# Patient Record
Sex: Male | Born: 1970
Health system: Southern US, Community
[De-identification: ages and names within clinical notes are randomized; demographics above are authoritative.]

## PROBLEM LIST (undated history)

## (undated) DIAGNOSIS — K219 Gastro-esophageal reflux disease without esophagitis: Secondary | ICD-10-CM

## (undated) DIAGNOSIS — K222 Esophageal obstruction: Secondary | ICD-10-CM

## (undated) DIAGNOSIS — T7840XA Allergy, unspecified, initial encounter: Secondary | ICD-10-CM

## (undated) DIAGNOSIS — K760 Fatty (change of) liver, not elsewhere classified: Secondary | ICD-10-CM

## (undated) DIAGNOSIS — I10 Essential (primary) hypertension: Principal | ICD-10-CM

## (undated) DIAGNOSIS — F419 Anxiety disorder, unspecified: Secondary | ICD-10-CM

## (undated) DIAGNOSIS — K449 Diaphragmatic hernia without obstruction or gangrene: Secondary | ICD-10-CM

## (undated) DIAGNOSIS — E785 Hyperlipidemia, unspecified: Secondary | ICD-10-CM

## (undated) DIAGNOSIS — E119 Type 2 diabetes mellitus without complications: Secondary | ICD-10-CM

## (undated) HISTORY — DX: Anxiety disorder, unspecified: F41.9

## (undated) HISTORY — PX: WISDOM TOOTH EXTRACTION: SHX21

## (undated) HISTORY — DX: Fatty (change of) liver, not elsewhere classified: K76.0

## (undated) HISTORY — DX: Esophageal obstruction: K22.2

## (undated) HISTORY — DX: Diaphragmatic hernia without obstruction or gangrene: K44.9

## (undated) HISTORY — DX: Hyperlipidemia, unspecified: E78.5

## (undated) HISTORY — DX: Type 2 diabetes mellitus without complications: E11.9

## (undated) HISTORY — PX: UPPER GASTROINTESTINAL ENDOSCOPY: SHX188

## (undated) HISTORY — DX: Essential (primary) hypertension: I10

## (undated) HISTORY — DX: Gastro-esophageal reflux disease without esophagitis: K21.9

## (undated) HISTORY — DX: Hemochromatosis, unspecified: E83.119

## (undated) HISTORY — DX: Allergy, unspecified, initial encounter: T78.40XA

---

## 1998-06-27 ENCOUNTER — Ambulatory Visit (HOSPITAL_COMMUNITY): Admission: RE | Admit: 1998-06-27 | Discharge: 1998-06-27 | Payer: Self-pay | Admitting: Gastroenterology

## 1998-06-27 HISTORY — PX: LIVER BIOPSY: SHX301

## 2005-03-26 DIAGNOSIS — I10 Essential (primary) hypertension: Secondary | ICD-10-CM

## 2005-03-26 HISTORY — DX: Essential (primary) hypertension: I10

## 2005-04-15 ENCOUNTER — Ambulatory Visit: Payer: Self-pay | Admitting: Family Medicine

## 2005-05-27 ENCOUNTER — Ambulatory Visit: Payer: Self-pay | Admitting: Family Medicine

## 2005-05-30 ENCOUNTER — Ambulatory Visit: Payer: Self-pay | Admitting: Family Medicine

## 2005-07-11 ENCOUNTER — Ambulatory Visit: Payer: Self-pay | Admitting: Family Medicine

## 2005-08-28 ENCOUNTER — Ambulatory Visit: Payer: Self-pay | Admitting: Family Medicine

## 2005-08-30 ENCOUNTER — Ambulatory Visit: Payer: Self-pay | Admitting: Family Medicine

## 2006-09-02 ENCOUNTER — Ambulatory Visit: Payer: Self-pay | Admitting: Family Medicine

## 2006-09-02 LAB — CONVERTED CEMR LAB: TSH: 2.53 microintl units/mL

## 2006-09-05 ENCOUNTER — Ambulatory Visit: Payer: Self-pay | Admitting: Family Medicine

## 2007-04-20 ENCOUNTER — Ambulatory Visit: Payer: Self-pay | Admitting: Family Medicine

## 2007-04-20 DIAGNOSIS — D485 Neoplasm of uncertain behavior of skin: Secondary | ICD-10-CM | POA: Insufficient documentation

## 2007-10-07 ENCOUNTER — Encounter: Payer: Self-pay | Admitting: Family Medicine

## 2007-10-07 ENCOUNTER — Ambulatory Visit: Payer: Self-pay | Admitting: Family Medicine

## 2007-10-07 DIAGNOSIS — E7800 Pure hypercholesterolemia, unspecified: Secondary | ICD-10-CM | POA: Insufficient documentation

## 2007-10-07 DIAGNOSIS — I10 Essential (primary) hypertension: Secondary | ICD-10-CM | POA: Insufficient documentation

## 2007-10-07 DIAGNOSIS — K760 Fatty (change of) liver, not elsewhere classified: Secondary | ICD-10-CM | POA: Insufficient documentation

## 2007-10-07 DIAGNOSIS — F419 Anxiety disorder, unspecified: Secondary | ICD-10-CM | POA: Insufficient documentation

## 2007-10-07 LAB — CONVERTED CEMR LAB
BUN: 13 mg/dL (ref 6–23)
Basophils Absolute: 0 10*3/uL (ref 0.0–0.1)
CO2: 26 meq/L (ref 19–32)
Eosinophils Absolute: 0.6 10*3/uL (ref 0.0–0.6)
GFR calc Af Amer: 121 mL/min
GFR calc non Af Amer: 100 mL/min
HCT: 45.2 % (ref 39.0–52.0)
Hemoglobin: 15.3 g/dL (ref 13.0–17.0)
Monocytes Absolute: 0.5 10*3/uL (ref 0.2–0.7)
Monocytes Relative: 7.7 % (ref 3.0–11.0)
Neutro Abs: 3 10*3/uL (ref 1.4–7.7)
RBC: 4.96 M/uL (ref 4.22–5.81)
RDW: 11.7 % (ref 11.5–14.6)
Sodium: 138 meq/L (ref 135–145)
WBC: 7 10*3/uL (ref 4.5–10.5)

## 2007-11-27 ENCOUNTER — Encounter: Payer: Self-pay | Admitting: Family Medicine

## 2007-12-10 ENCOUNTER — Ambulatory Visit: Payer: Self-pay | Admitting: Family Medicine

## 2007-12-10 DIAGNOSIS — IMO0001 Reserved for inherently not codable concepts without codable children: Secondary | ICD-10-CM | POA: Insufficient documentation

## 2007-12-16 ENCOUNTER — Ambulatory Visit: Payer: Self-pay | Admitting: Family Medicine

## 2007-12-17 ENCOUNTER — Encounter (INDEPENDENT_AMBULATORY_CARE_PROVIDER_SITE_OTHER): Payer: Self-pay | Admitting: Internal Medicine

## 2008-01-20 ENCOUNTER — Ambulatory Visit: Payer: Self-pay | Admitting: Internal Medicine

## 2008-01-21 ENCOUNTER — Encounter (INDEPENDENT_AMBULATORY_CARE_PROVIDER_SITE_OTHER): Payer: Self-pay | Admitting: Internal Medicine

## 2008-02-01 ENCOUNTER — Ambulatory Visit: Payer: Self-pay | Admitting: Internal Medicine

## 2008-02-04 ENCOUNTER — Ambulatory Visit: Payer: Self-pay | Admitting: Family Medicine

## 2008-02-05 ENCOUNTER — Encounter (INDEPENDENT_AMBULATORY_CARE_PROVIDER_SITE_OTHER): Payer: Self-pay | Admitting: Internal Medicine

## 2008-02-08 ENCOUNTER — Ambulatory Visit: Payer: Self-pay | Admitting: Family Medicine

## 2008-02-10 ENCOUNTER — Ambulatory Visit: Payer: Self-pay | Admitting: Family Medicine

## 2008-04-28 ENCOUNTER — Ambulatory Visit: Payer: Self-pay | Admitting: Family Medicine

## 2008-04-29 ENCOUNTER — Encounter: Payer: Self-pay | Admitting: Family Medicine

## 2009-03-20 ENCOUNTER — Telehealth: Payer: Self-pay | Admitting: Family Medicine

## 2009-03-20 ENCOUNTER — Telehealth (INDEPENDENT_AMBULATORY_CARE_PROVIDER_SITE_OTHER): Payer: Self-pay | Admitting: *Deleted

## 2009-05-26 ENCOUNTER — Ambulatory Visit: Payer: Self-pay | Admitting: Family Medicine

## 2009-05-28 LAB — CONVERTED CEMR LAB
ALT: 82 units/L — ABNORMAL HIGH (ref 0–53)
AST: 40 units/L — ABNORMAL HIGH (ref 0–37)
Albumin: 4.4 g/dL (ref 3.5–5.2)
BUN: 18 mg/dL (ref 6–23)
Basophils Relative: 0.7 % (ref 0.0–3.0)
Bilirubin, Direct: 0.1 mg/dL (ref 0.0–0.3)
Calcium: 9.1 mg/dL (ref 8.4–10.5)
Creatinine, Ser: 0.9 mg/dL (ref 0.4–1.5)
Creatinine,U: 204 mg/dL
Eosinophils Relative: 4.7 % (ref 0.0–5.0)
GFR calc non Af Amer: 98.99 mL/min (ref >60)
Glucose, Bld: 108 mg/dL — ABNORMAL HIGH (ref 70–99)
HCT: 44.7 % (ref 39.0–52.0)
MCHC: 34.9 g/dL (ref 30.0–36.0)
Monocytes Relative: 7.9 % (ref 3.0–12.0)
Platelets: 244 10*3/uL (ref 150.0–400.0)
Potassium: 4.1 meq/L (ref 3.5–5.1)
RBC: 4.9 M/uL (ref 4.22–5.81)
TSH: 3.69 microintl units/mL (ref 0.35–5.50)
Total CHOL/HDL Ratio: 4

## 2009-05-30 ENCOUNTER — Ambulatory Visit: Payer: Self-pay | Admitting: Family Medicine

## 2009-08-26 DIAGNOSIS — E119 Type 2 diabetes mellitus without complications: Secondary | ICD-10-CM

## 2009-08-26 HISTORY — DX: Type 2 diabetes mellitus without complications: E11.9

## 2010-01-29 ENCOUNTER — Ambulatory Visit: Payer: Self-pay | Admitting: Family Medicine

## 2010-01-29 LAB — CONVERTED CEMR LAB
ALT: 91 units/L — ABNORMAL HIGH (ref 0–53)
Albumin: 4.1 g/dL (ref 3.5–5.2)
BUN: 13 mg/dL (ref 6–23)
Basophils Absolute: 0 10*3/uL (ref 0.0–0.1)
Basophils Relative: 0.4 % (ref 0.0–3.0)
Bilirubin, Direct: 0.1 mg/dL (ref 0.0–0.3)
Calcium: 9.1 mg/dL (ref 8.4–10.5)
Creatinine, Ser: 0.8 mg/dL (ref 0.4–1.5)
Eosinophils Relative: 6.9 % — ABNORMAL HIGH (ref 0.0–5.0)
HCT: 44 % (ref 39.0–52.0)
Hemoglobin: 15.5 g/dL (ref 13.0–17.0)
Lymphocytes Relative: 38.1 % (ref 12.0–46.0)
Lymphs Abs: 2.6 10*3/uL (ref 0.7–4.0)
MCV: 89.6 fL (ref 78.0–100.0)
Monocytes Relative: 7.5 % (ref 3.0–12.0)
Neutrophils Relative %: 47.1 % (ref 43.0–77.0)
RDW: 12.8 % (ref 11.5–14.6)
Sodium: 141 meq/L (ref 135–145)
Total Bilirubin: 0.7 mg/dL (ref 0.3–1.2)
WBC: 6.8 10*3/uL (ref 4.5–10.5)

## 2010-01-31 ENCOUNTER — Encounter: Admission: RE | Admit: 2010-01-31 | Discharge: 2010-01-31 | Payer: Self-pay | Admitting: Family Medicine

## 2010-02-01 ENCOUNTER — Encounter (INDEPENDENT_AMBULATORY_CARE_PROVIDER_SITE_OTHER): Payer: Self-pay | Admitting: *Deleted

## 2010-02-23 DIAGNOSIS — K222 Esophageal obstruction: Secondary | ICD-10-CM

## 2010-02-23 HISTORY — DX: Esophageal obstruction: K22.2

## 2010-03-08 ENCOUNTER — Encounter (INDEPENDENT_AMBULATORY_CARE_PROVIDER_SITE_OTHER): Payer: Self-pay | Admitting: *Deleted

## 2010-03-08 ENCOUNTER — Ambulatory Visit: Payer: Self-pay | Admitting: Gastroenterology

## 2010-03-08 DIAGNOSIS — R1319 Other dysphagia: Secondary | ICD-10-CM | POA: Insufficient documentation

## 2010-03-08 DIAGNOSIS — E669 Obesity, unspecified: Secondary | ICD-10-CM | POA: Insufficient documentation

## 2010-03-08 LAB — CONVERTED CEMR LAB
HCV Ab: NEGATIVE
Tissue Transglutaminase Ab, IgA: 1.8 units (ref <20)

## 2010-03-12 ENCOUNTER — Ambulatory Visit: Payer: Self-pay | Admitting: Gastroenterology

## 2010-03-12 LAB — CONVERTED CEMR LAB
Albumin: 4.5 g/dL (ref 3.5–5.2)
Alkaline Phosphatase: 72 units/L (ref 39–117)
Basophils Relative: 0.8 % (ref 0.0–3.0)
CO2: 28 meq/L (ref 19–32)
Calcium: 9.3 mg/dL (ref 8.4–10.5)
Chloride: 103 meq/L (ref 96–112)
Creatinine, Ser: 0.7 mg/dL (ref 0.4–1.5)
Eosinophils Absolute: 0.4 10*3/uL (ref 0.0–0.7)
Ferritin: 340.1 ng/mL — ABNORMAL HIGH (ref 22.0–322.0)
Glucose, Bld: 172 mg/dL — ABNORMAL HIGH (ref 70–99)
HDL: 40.6 mg/dL (ref >39.00)
Neutrophils Relative %: 50.7 % (ref 43.0–77.0)
Platelets: 252 10*3/uL (ref 150.0–400.0)
RBC: 4.94 M/uL (ref 4.22–5.81)
Saturation Ratios: 26.5 % (ref 20.0–50.0)
Total Bilirubin: 0.7 mg/dL (ref 0.3–1.2)
Total Protein: 7.4 g/dL (ref 6.0–8.3)
Triglycerides: 117 mg/dL (ref 0.0–149.0)
VLDL: 23.4 mg/dL (ref 0.0–40.0)
Vitamin B-12: 527 pg/mL (ref 211–911)
WBC: 6.6 10*3/uL (ref 4.5–10.5)

## 2010-03-14 ENCOUNTER — Ambulatory Visit: Payer: Self-pay | Admitting: Gastroenterology

## 2010-03-14 LAB — CONVERTED CEMR LAB: T4, Total: 5.9 ug/dL (ref 5.0–12.5)

## 2010-04-03 ENCOUNTER — Encounter (INDEPENDENT_AMBULATORY_CARE_PROVIDER_SITE_OTHER): Payer: Self-pay | Admitting: *Deleted

## 2010-04-09 ENCOUNTER — Telehealth: Payer: Self-pay | Admitting: Gastroenterology

## 2010-04-12 ENCOUNTER — Ambulatory Visit: Payer: Self-pay | Admitting: Gastroenterology

## 2010-04-20 ENCOUNTER — Encounter: Payer: Self-pay | Admitting: Gastroenterology

## 2010-04-20 ENCOUNTER — Encounter: Admission: RE | Admit: 2010-04-20 | Discharge: 2010-05-25 | Payer: Self-pay | Admitting: Gastroenterology

## 2010-05-31 ENCOUNTER — Telehealth (INDEPENDENT_AMBULATORY_CARE_PROVIDER_SITE_OTHER): Payer: Self-pay | Admitting: *Deleted

## 2010-05-31 ENCOUNTER — Ambulatory Visit: Payer: Self-pay | Admitting: Family Medicine

## 2010-06-04 ENCOUNTER — Ambulatory Visit: Payer: Self-pay | Admitting: Family Medicine

## 2010-09-25 NOTE — Letter (Signed)
Summary: New Patient letter  [facility]  [address] [place], [place] [address]   Phone: [phone]  Fax: [phone]       02/01/2010 MRN: [id number]  Micheal Cruz [address] [place], [place]  [address]  Dear Micheal Cruz,  Welcome to the Gastroenterology Division at [facility].    You are scheduled to see Dr. Jarold Motto on 03/08/2010 at 8:30AM on the 3rd floor at [facility], [address]  We ask that you try to arrive at our office 15 minutes prior to your appointment time to allow for check-in.  We would like you to complete the enclosed self-administered evaluation form prior to your visit and bring it with you on the day of your appointment.  We will review it with you.  Also, please bring a complete list of all your medications or, if you prefer, bring the medication bottles and we will list them.  Please bring your insurance card so that we may make a copy of it.  If your insurance requires a referral to see a specialist, please bring your referral form from your primary care physician.  Co-payments are due at the time of your visit and may be paid by cash, check or credit card.     Your office visit will consist of a consult with your physician (includes a physical exam), any laboratory testing he/she may order, scheduling of any necessary diagnostic testing (e.g. x-ray, ultrasound, CT-scan), and scheduling of a procedure (e.g. Endoscopy, Colonoscopy) if required.  Please allow enough time on your schedule to allow for any/all of these possibilities.    If you cannot keep your appointment, please call [phone] to cancel or reschedule prior to your appointment date.  This allows [place] the opportunity to schedule an appointment for another patient in need of care.  If you do not cancel or reschedule by 5 p.m. the business day prior to your appointment date, you will be charged a $50.00 late cancellation/no-show fee.    Thank you for choosing  [facility] for your medical needs.  We appreciate the opportunity to care for you.  Please visit [place] at our website  to learn more about our practice.                     Sincerely,                                                             The Gastroenterology Division

## 2010-09-25 NOTE — Assessment & Plan Note (Signed)
Summary: STOMACH DISCOMFORT GOING INTO BACK/JRR   Vital Signs:  Patient profile:   40 year old male Weight:      209 pounds Temp:     98.4 degrees F oral Pulse rate:   64 / minute Pulse rhythm:   regular BP sitting:   138 / 84  (left arm) Cuff size:   large  Vitals Entered By: Sydell Axon LPN (January 30, 8412 10:27 AM) CC: Riight side stomach pain and pressure that goes around into the back area and shoulder blade   History of Present Illness: t ere for abd "discomfort" not pain which began about two weeks ago and was worst Fri. He can't tell if it worsens with fatty foods and he thinks it kind of goes away with eating. It is located in the RUQ and will radiate to right shoulder. He has had mopre gas than usual with this. He had mild discomfort again Sat, none since. He has no idea if FH of GB disease.  Problems Prior to Update: 1)  Health Maintenance Exam  (ICD-V70.0) 2)  Muscle Pain  (ICD-729.1) 3)  Hypercholesterolemia  (ICD-272.0) 4)  Anxiety Depression  (ICD-300.4) 5)  Hyperglycemia  (ICD-790.29) 6)  Fatty Liver Disease  (ICD-571.8) 7)  Hypertension  (ICD-401.9) 8)  Neoplasm, Skin, Uncertain Behavior  (ICD-238.2)  Medications Prior to Update: 1)  Lescol Xl 80 Mg Tb24 (Fluvastatin Sodium) .... Take One By Mouth At Bedtime 2)  Toprol Xl 50 Mg  Tb24 (Metoprolol Succinate) .... Take 11/2 Tablets By Mouth Once A Day  Allergies: 1)  ! Amoxicillin (Amoxicillin)  Physical Exam  General:  Well-developed,well-nourished,in no acute distress; alert,appropriate and cooperative throughout examination, comfortable. Head:  Normocephalic and atraumatic without obvious abnormalities. No apparent alopecia or balding. Eyes:  Conjunctiva clear bilaterally. PERRLA, EOMI Ears:  External ear exam shows no significant lesions or deformities.  Otoscopic examination reveals clear canals, tympanic membranes are intact bilaterally without bulging, retraction, inflammation or discharge. Hearing is  grossly normal bilaterally. Nose:  External nasal examination shows no deformity or inflammation. Nasal mucosa are pink and moist without lesions or exudates. Mouth:  Oral mucosa and oropharynx without lesions or exudates.  Teeth in good repair. Neck:  No deformities, masses, or tenderness noted. Lungs:  Normal respiratory effort, chest expands symmetrically. Lungs are clear to auscultation, no crackles or wheezes. Heart:  Normal rate and regular rhythm. S1 and S2 normal without gallop, murmur, click, rub or other extra sounds. Abdomen:  Bowel sounds positive,abdomen soft and non-tender without masses, organomegaly or hernias noted. Totally nontender to palpation but area of discomfort at GB location.   Impression & Recommendations:  Problem # 1:  ABDOMINAL PAIN RIGHT UPPER QUADRANT (ICD-789.01) Assessment New Avoid fatty foods. Will get labs and schedule Abd U/S to rule out GB dz. If neg, to GI. Orders: TLB-BMP (Basic Metabolic Panel-BMET) (80048-METABOL) TLB-CBC Platelet - w/Differential (85025-CBCD) TLB-Hepatic/Liver Function Pnl (80076-HEPATIC) TLB-Amylase (82150-AMYL) TLB-Lipase (83690-LIPASE) Radiology Referral (Radiology)  Discussed symptom control with the patient.   Complete Medication List: 1)  Lescol Xl 80 Mg Tb24 (Fluvastatin sodium) .... Take one by mouth at bedtime 2)  Toprol Xl 50 Mg Tb24 (Metoprolol succinate) .... Take 1 1/2 tablets by mouth once a day  Patient Instructions: 1)  Refer for Abd U/s.  Current Allergies (reviewed today): ! AMOXICILLIN (AMOXICILLIN)

## 2010-09-25 NOTE — Letter (Signed)
Summary: [name] letter  [facility]  [address] [place], [place] [address]   Phone: [phone]  Fax: [phone]       04/03/2010 MRN: [id number]  Micheal Cruz [address] [place], [place]  [address]  Dear Micheal Cruz [facility] [place] and [place] announce the retirement of Arta Silence, M.D., from full-time practice at the [facility] office effective February 22, 2010 and his plans of returning part-time.  It is important to Dr. Hetty Ely and to our practice that you understand that [facility] has seven physicians in our office for your health care needs.  We will continue to offer the same exceptional care that you have today.    Dr. Hetty Ely has spoken to many of you about his plans for retirement and returning part-time in the fall.   We will continue to work with you through the transition to schedule appointments for you in the office and meet the high standards that [facility] is committed to.   Again, it is with great pleasure that we share the news that Dr. Hetty Ely will return to [facility] in October of 2011 with a reduced schedule.    If you have any questions, or would like to request an appointment with one of our physicians, please call us at [phone] and press the option for Scheduling an appointment.  We take pleasure in providing you with excellent patient care and look forward to seeing you at your next office visit.  Our [facility] Physicians are:  Tillman Abide, M.D. Laurita Quint, M.D. Roxy Manns, M.D. Kerby Nora, M.D. Hannah Beat, M.D. Ruthe Mannan, M.D. We proudly welcomed Raechel Ache, M.D. and Eustaquio Boyden, M.D. to the practice in July/August 2011.  Sincerely,  [facility]

## 2010-09-25 NOTE — Letter (Signed)
Summary: EGD Instructions  [facility]  [address] [place], [place] [address]   Phone: [phone]  Fax: [phone]       Micheal Cruz    February 19, 1971    MRN: [id number]       Procedure Day /Date: Monday, 03/12/10     Arrival Time: 2:00     Procedure Time: 3:00     Location of Procedure:                    [name] (4th [facility])    PREPARATION FOR ENDOSCOPY   On 03/12/10  THE DAY OF THE PROCEDURE:  1.   No solid foods, milk or milk products are allowed after midnight the night before your procedure.  2.   Do not drink anything colored red or purple.  Avoid juices with pulp.  No orange juice.  3.  You may drink clear liquids until 1:00, which is 2 hours before your procedure.                                                                                                CLEAR LIQUIDS INCLUDE: Water Jello Ice Popsicles Tea (sugar ok, no milk/cream) Powdered fruit flavored drinks Coffee (sugar ok, no milk/cream) Gatorade Juice: apple, white grape, white cranberry  Lemonade Clear bullion, consomm, broth Carbonated beverages (any kind) Strained chicken noodle soup Hard Candy   MEDICATION INSTRUCTIONS  Unless otherwise instructed, you should take regular prescription medications with a small sip of water as early as possible the morning of your procedure.                      OTHER INSTRUCTIONS  You will need a responsible adult at least 42 years of age to accompany you and drive you home.   This person must remain in the waiting room during your procedure.  Wear loose fitting clothing that is easily removed.  Leave jewelry and other valuables at home.  However, you may wish to bring a book to read or an iPod/MP3 player to listen to music as you wait for your procedure to start.  Remove all body piercing jewelry and leave at home.  Total time from sign-in until discharge is approximately 2-3 hours.  You should go  home directly after your procedure and rest.  You can resume normal activities the day after your procedure.  The day of your procedure you should not:   Drive   Make legal decisions   Operate machinery   Drink alcohol   Return to work  You will receive specific instructions about eating, activities and medications before you leave.    The above instructions have been reviewed and explained to me by   _______________________    I fully understand and can verbalize these instructions _____________________________ Date _________

## 2010-09-25 NOTE — Progress Notes (Signed)
----   Converted from flag ---- ---- 05/30/2010 1:15 PM, Crawford Givens MD wrote: he only needs fasting glucose and A1c.  790.29.  He had most labs check in 02/2010.   ---- 05/30/2010 11:38 AM, Liane Comber CMA (AAMA) wrote: Lab orders please! Good Morning! This pt is scheduled for cpx labs tomorrow, which labs to draw and dx codes to use? Thanks Tasha ------------------------------

## 2010-09-25 NOTE — Assessment & Plan Note (Signed)
Summary: F/U APPT...LSW.   History of Present Illness Visit Type: Follow-up Visit Primary GI MD: Sheryn Bison MD FACP FAGA Primary Provider: Laurita Quint, MD Requesting Provider: n/a Chief Complaint: Follow up from Endo and Aciphex denial from Insurance,No GI complaints History of Present Illness:   esophageal biopsies consistent with chronic GERD. He is asymptomatic on PPI therapy. There is no dysphagia since his dilation. Iron studies and hemochromatosis genetics are consistent with a heterozygote for hemachromatosis. He has mildly abnormal liver function tests apparently had a negative liver biopsy by Dr. Kinnie Scales 10 years ago. He has no symptoms of chronic liver disease. His TSH level was mildly elevated but thyroid function tests were normal.   GI Review of Systems      Denies abdominal pain, acid reflux, belching, bloating, chest pain, dysphagia with liquids, dysphagia with solids, heartburn, loss of appetite, nausea, vomiting, vomiting blood, weight loss, and  weight gain.        Denies anal fissure, black tarry stools, change in bowel habit, constipation, diarrhea, diverticulosis, fecal incontinence, heme positive stool, hemorrhoids, irritable bowel syndrome, jaundice, light color stool, liver problems, rectal bleeding, and  rectal pain.    Current Medications (verified): 1)  Lescol Xl 80 Mg Tb24 (Fluvastatin Sodium) .... Take One By Mouth At Bedtime 2)  Toprol Xl 50 Mg  Tb24 (Metoprolol Succinate) .... Take 1 1/2 Tablets By Mouth Once A Day 3)  Aciphex 20 Mg  Tbec (Rabeprazole Sodium) .... Take 1 Each Day 30 Minutes Before Meals  Allergies (verified): 1)  ! Amoxicillin (Amoxicillin)  Past History:  Family History: Last updated: 05/30/2009 Father:A 70  HTN; HEART DISEASE ; DM  Mother: :A 82  ANXIETY, HTN; DM; DEPRESSION SISTER A 35 ANXIETY CV:+ FATHER(MI);  HBP:+MOTHER; + FATHER; +PGM;+PGF; +MGM; +MGF DM: +FATHER; + MOTHER GOUT/ARTHRITIS: PROSTATE CANCER:--            ?MGF BREAST/OVARIAN/UTERINE /CANCER: COLON CANCER: NEGATIVE DEPRESSION: +MOTHER; +MUNCLE; +MGM; +MGGF ETOH ABUSE: + MOTHERS SIDE OTHER:+ STROKE ? ?SKIN CANCER IN UNCLE?  Social History: Last updated: 03/08/2010 Marital Status: MarriedLIVES WITH WIFE Children: NONE Occupation: [employer] Patient has never smoked.  Alcohol Use - no Daily Caffeine Use -4 Illicit Drug Use - no  Past medical, surgical, family and social histories (including risk factors) reviewed for relevance to current acute and chronic problems.  Past Medical History: Hypertension: ( 03/2005) Anxiety Disorder GERD  Past Surgical History: Reviewed history from 10/07/2007 and no changes required. LIVER BIOPSY-- FATTY LIVER:(11/O2/1999)  Family History: Reviewed history from 05/30/2009 and no changes required. Father:A 70  HTN; HEART DISEASE ; DM  Mother: :A 58  ANXIETY, HTN; DM; DEPRESSION SISTER A 35 ANXIETY CV:+ FATHER(MI);  HBP:+MOTHER; + FATHER; +PGM;+PGF; +MGM; +MGF DM: +FATHER; + MOTHER GOUT/ARTHRITIS: PROSTATE CANCER:--           ?MGF BREAST/OVARIAN/UTERINE /CANCER: COLON CANCER: NEGATIVE DEPRESSION: +MOTHER; +MUNCLE; +MGM; +MGGF ETOH ABUSE: + MOTHERS SIDE OTHER:+ STROKE ? ?SKIN CANCER IN UNCLE?  Social History: Reviewed history from 03/08/2010 and no changes required. Marital Status: MarriedLIVES WITH WIFE Children: NONE Occupation: [employer] Patient has never smoked.  Alcohol Use - no Daily Caffeine Use -4 Illicit Drug Use - no  Review of Systems  The patient denies allergy/sinus, anemia, anxiety-new, arthritis/joint pain, back pain, blood in urine, breast changes/lumps, change in vision, confusion, cough, coughing up blood, depression-new, fainting, fatigue, fever, headaches-new, hearing problems, heart murmur, heart rhythm changes, itching, muscle pains/cramps, night sweats, nosebleeds, shortness  of breath, skin rash,  sleeping problems, sore throat, swelling of feet/legs, swollen lymph glands, thirst - excessive, urination - excessive, urination changes/pain, urine leakage, vision changes, and voice change.    Vital Signs:  Patient profile:   40 year old male Height:      65 inches Weight:      207 pounds BMI:     34.57 BSA:     2.01 Pulse rate:   76 / minute Pulse rhythm:   regular BP sitting:   132 / 82  (left arm)  Vitals Entered By: Merri Ray CMA Duncan Dull) (April 12, 2010 8:53 AM)  Physical Exam  General:  Well developed, well nourished, no acute distress. Head:  Normocephalic and atraumatic. Eyes:  PERRLA, no icterus.exam deferred to patient's ophthalmologist.   Psych:  Alert and cooperative. Normal mood and affect.   Impression & Recommendations:  Problem # 1:  DYSPHAGIA (FIE-332.95) Assessment Improved Continued reflux regime and daily PPI therapy.  Problem # 2:  HEMOCHROMATOSIS (ICD-275.0) Assessment: Unchanged His heterozygote state for hemochromatosis most likely explains his abnormal liver function test with a previous negative biopsy otherwise. He should have yearly liver function test and serum ferritin levels. There is no direct indication for regular phlebotomies in his case, but I would consider phlebotomies if his liver functions deteriorate with the develops any arthritis, diabetes, cardiovascular problems et Karie Soda. He has been given a copy of the genetic assay report for his own records.  Problem # 3:  ABDOMINAL PAIN RIGHT UPPER QUADRANT (ICD-789.01) Assessment: Improved  Patient Instructions: 1)  Stop Aciphex.  Begin Lansoprazole. 2)  The medication list was reviewed and reconciled.  All changed / newly prescribed medications were explained.  A complete medication list was provided to the patient / caregiver. 3)  Copy sent to : Dr. Laurita Quint 4)  Avoid foods high in acid content ( tomatoes, citrus juices, spicy foods) . Avoid eating within 3 to 4 hours of lying  down or before exercising. Do not over eat; try smaller more frequent meals. Elevate head of bed four inches when sleeping.  Prescriptions: LANSOPRAZOLE 30 MG CPDR (LANSOPRAZOLE) 1 by mouth once daily  #90 x 3   Entered by:   Ashok Cordia RN   Authorized by:   Mardella Layman MD [facility]   Signed by:   Ashok Cordia RN on 04/12/2010   Method used:   Print then Give to Patient   RxID:   [id number]   Appended Document: F/U APPT...LSW.    Clinical Lists Changes  Medications: Removed medication of ACIPHEX 20 MG  TBEC (RABEPRAZOLE SODIUM) Take 1 each day 30 minutes before meals

## 2010-09-25 NOTE — Letter (Signed)
Summary: Patient [facility] Biopsy Results  [facility]  [address] [place], [place] [address]   Phone: [phone]  Fax: [phone]        March 14, 2010 MRN: [id number]    DAIMEN SHOVLIN [address] [place], [place]  [address]    Dear Mr. Inglis,  I am pleased to inform you that the biopsies taken during your recent endoscopic examination did not show any evidence of cancer upon pathologic examination.  Additional information/recommendations:  __No further action is needed at this time.  Please follow-up with      your primary care physician for your other healthcare needs.  __ Please call [phone] to schedule a return visit to review      your condition.  _XX_ Continue with the treatment plan as outlined on the day of your      exam.  __ You should have a repeat endoscopic examination for this problem              in _ months/years.   Please call us if you are having persistent problems or have questions about your condition that have not been fully answered at this time.  Sincerely,  Mardella Layman MD [facility]  This letter has been electronically signed by your physician.  Appended Document: Patient Notice-Endo Biopsy Results letter mailed.

## 2010-09-25 NOTE — Assessment & Plan Note (Signed)
Summary: CPX/SCHALLER'S PT/CLE   Vital Signs:  Patient profile:   40 year old male Weight:      203 pounds Temp:     98.7 degrees F oral Pulse rate:   60 / minute Pulse rhythm:   regular BP sitting:   120 / 80  (left arm) Cuff size:   large  Vitals Entered By: Sydell Axon LPN (June 04, 2010 8:30 AM) CC: 30 Minute checkup   History of Present Illness: CPE- labs reviewed with patient.    GERD- H/o HH and ring, s/p dilation and doing much better.  No chest pain now after dilation.  Hemachromatosis per GI.  H/o fatty liver.  Anxiety.  No meds.  Situational per patient.  +FH.  Has mental exercises to work through the symptoms.    Hypertension:      Using medication without problems or lightheadedness: yes Chest pain with exertion:no Edema:no Short of breath:no Average home TKZ:SWFUXN checked Other issues: exercising most days of the week, started to increase his exercise in 02/2010.   Elevated Cholesterol: Using medications without problems:yes Muscle aches: no Other complaints: labs d/w patient.   Diabetes:  Using medications without difficulties:no meds Hypoglycemic episodes:not checked Hyperglycemic episodes:not checked Feet problems:no Blood Sugars averaging:not checked.  eye exam within last year: no  Allergies: 1)  ! Amoxicillin (Amoxicillin)  Past History:  Past Medical History: Hypertension: ( 03/2005) Anxiety Disorder GERD Hiatal hernia hemachromatosis per GI, ferritin and LFTs q6 months per Dr. Jarold Motto Schatzki's ring s/p dilation 02/2010 per Dr. Jarold Motto DM- diet controlled as of 2011 HLD  Past Surgical History: Reviewed history from 10/07/2007 and no changes required. LIVER BIOPSY-- FATTY LIVER:(11/O2/1999)  Family History: Reviewed history from 05/30/2009 and no changes required. Father:A  HTN; HEART DISEASE ; DM  Mother: :A  ANXIETY, HTN; DM; DEPRESSION SISTER A  ANXIETY CV:+ FATHER(MI);  HBP:+MOTHER; + FATHER; +PGM;+PGF; +MGM;  +MGF DM: +FATHER; + MOTHER GOUT/ARTHRITIS: PROSTATE CANCER:--           ?MGF BREAST/OVARIAN/UTERINE /CANCER: COLON CANCER: NEGATIVE DEPRESSION: +MOTHER; +MUNCLE; +MGM; +MGGF ETOH ABUSE: + MOTHERS SIDE OTHER:+ STROKE ? ?SKIN CANCER IN UNCLE?  Social History: Reviewed history from 03/08/2010 and no changes required. Marital Status: Married, 2000,LIVES WITH WIFE Children: NONE Occupation: [employer] Patient has never smoked.  Alcohol Use - very rare Daily Caffeine Use - iced tea, cutting down Illicit Drug Use - no minimal exercise, enjoys building models  Review of Systems       See HPI.  Otherwise negative.    Physical Exam  General:  GEN: nad, alert and oriented HEENT: mucous membranes moist NECK: supple w/o LA CV: rrr.  no murmur PULM: ctab, no inc wob ABD: soft, +bs EXT: no edema SKIN: no acute rash    Impression & Recommendations:  Problem # 1:  Preventive Health Care (ICD-V70.0) Flu shot to be done at work.  d/w patient AT:FTDD and exercise along with weight.  Tdap up to date.    Problem # 2:  HYPERCHOLESTEROLEMIA (ICD-272.0) no change in meds, prev labs d/w patient.  His updated medication list for this problem includes:    Lescol Xl 80 Mg Tb24 (Fluvastatin sodium) .Marland Kitchen... Take one by mouth at bedtime  Problem # 3:  FATTY LIVER DISEASE (ICD-571.8) d/w patient UK:GURK/YHCWCBJS and weight.  prev Lfts d/w patient.   Problem # 4:  DIABETES MELLITUS (ICD-250.00) d/w patient EG:BTDV/VOHYWVPX and weight.  No change in meds.  Continue to monitor.   Complete  Medication List: 1)  Lescol Xl 80 Mg Tb24 (Fluvastatin sodium) .... Take one by mouth at bedtime 2)  Toprol Xl 50 Mg Tb24 (Metoprolol succinate) .... Take 1 1/2 tablets by mouth once a day 3)  Lansoprazole 30 Mg Cpdr (Lansoprazole) .Marland Kitchen.. 1 by mouth once daily  Patient Instructions: 1)  I would like to see you back in 6 months.  Call Dr. Norval Gable office about getting a follow up  appointment. 2)  I want you to get your A1c drawn again in 6 months a few days before we meet again.  Keep exercising and working on your weight.  Take care, glad to see you today. 3)  Get your flu shot at work. 4)  labs in 6 months.  A1c, 250.00.    ferritin and hepatic panel (sent to Dr. Jarold Motto) 275.0  Current Allergies (reviewed today): ! AMOXICILLIN (AMOXICILLIN)

## 2010-09-25 NOTE — Procedures (Signed)
Summary: Upper Endoscopy  Patient: Micheal Cruz Note: All result statuses are Final unless otherwise noted.  Tests: (1) Upper Endoscopy (EGD)   EGD Upper Endoscopy       DONE     [facility]     [address]     [place], [place]  [address]           ENDOSCOPY PROCEDURE REPORT           PATIENT:  Micheal, Cruz  MR#:  [id number]     BIRTHDATE:  [birth year], 39 yrs. old  GENDER:  male           ENDOSCOPIST:  Vania Rea. Jarold Motto, MD, [facility]     Referred by:  Laurita Quint, M.D.           PROCEDURE DATE:  03/12/2010     PROCEDURE:  EGD with biopsy, Maloney Dilation of Esophagus     ASA CLASS:  Class II     INDICATIONS:  dysphagia           MEDICATIONS:   Fentanyl 75 mcg IV, Versed 6 mg IV     TOPICAL ANESTHETIC:  Exactacain Spray           DESCRIPTION OF PROCEDURE:   After the risks benefits and     alternatives of the procedure were thoroughly explained, informed     consent was obtained.  The LB GIF-H180 [id number] endoscope was     introduced through the mouth and advanced to the second portion of     the duodenum, without limitations.  The instrument was slowly     withdrawn as the mucosa was fully examined.     <<PROCEDUREIMAGES>>           Esophagitis was found. healing erosion at GE junction.  A     Schatzki's ring was found at the gastroesophageal junction.     dilated #18F MALONEY DILATOR.TOLERATED WELL.  The duodenal bulb     was normal in appearance, as was the postbulbar duodenum.  The     stomach was entered and closely examined. The antrum, angularis,     and lesser curvature were well visualized, including a retroflexed     view of the cardia and fundus. The stomach wall was normally     distensable. The scope passed easily through the pylorus into the     duodenum.    Retroflexed views revealed a hiatal hernia.  SMALL 2     CM HH NOTED.  The scope was then withdrawn from the patient and     the procedure completed.           COMPLICATIONS:  None         ENDOSCOPIC IMPRESSION:     1) Esophagitis     2) Schatzki's ring at the gastroesophageal junction     3) Normal duodenum     4) Normal stomach     5) A hiatal hernia     HEALING EROSION AT GE JUNCTION.I DX. HERE C/W SCHATZSKI'S RING     AND TRAUMA.R/O EOSINOPHILIC ESOPHAGITIS.     RECOMMENDATIONS:     1) Await biopsy results     2) Clear liquids until, then soft foods rest iof day. Resume     prior diet tomorrow.     3) OP follow-up in 2 weeks.     HEPATIC W/U I PROCESS.           REPEAT EXAM:  No  ______________________________     Vania Rea. Jarold Motto, MD, [facility]           CC:           n.     eSIGNED:   Vania Rea. Patterson at 03/12/2010 03:35 PM           Drexel Iha, [id number]  Note: An exclamation mark (!) indicates a result that was not dispersed into the flowsheet. Document Creation Date: 03/12/2010 3:37 PM _______________________________________________________________________  (1) Order result status: Final Collection or observation date-time: 03/12/2010 15:24 Requested date-time:  Receipt date-time:  Reported date-time:  Referring Physician:   Ordering Physician: Sheryn Bison [phone]) Specimen Source:  Source: Launa Grill Order Number: [id number] Lab site:

## 2010-09-25 NOTE — Letter (Signed)
Summary: [facility]  [facility]   Imported By: Lennie Odor 05/04/2010 16:56:56  _____________________________________________________________________  External Attachment:    Type:   Image     Comment:   External Document

## 2010-09-25 NOTE — Assessment & Plan Note (Signed)
Summary: RIGHT UPPER QUAD PAIN...AS.   History of Present Illness Visit Type: Initial Consult Primary GI MD: Sheryn Bison MD FACP FAGA Primary Provider: Laurita Quint, MD Requesting Provider: Laurita Quint, MD Chief Complaint: RUQ pain that radiates downward and to back, last episode 2 weeks ago History of Present Illness:   40 year old Caucasian male referred for evaluation right upper quadrant pain and abnormal liver function tests.  Micheal Cruz has a long history of abnormal liver enzymes over the last 10 years, and apparently had previous evaluation liver biopsy by Dr. Kinnie Scales 10 years ago. His records are not available for review. Apparently he was told that he had a fatty liver and weight loss was urged. He now presents with several months of dull aching discomfort in the right upper quadrant without other hepatobiliary symptomatology or systemic symptoms. Ultrasounds confirmed fatty infiltration of liver but no evidence of cholelithiasis or other abnormalities. He specifically denies clay-colored stools, dark urine, icterus, but he does have diffuse pruritus. Mental status has been normal. Chronically he has had intermittent solid food dysphagia but never has been evaluated with barium studies or endoscopy. There is no history of chronic GERD.  He has regular daily bowel movements without melena or hematochezia. He has gained 25-30 pounds over the last 5 years. He does complain of excessive gas and flatus. He does not have any specific food intolerances or lactose intolerance, and denies use of sorbitol or fructose in excess. Family history is noncontributory.  He does have elevated blood sugars but is not on diabetic medication. He also has borderline hypertension but no other cardiac history. He does not use ethanol, cigarettes, or NSAIDs.   GI Review of Systems    Reports abdominal pain, acid reflux, belching, bloating, chest pain, and  dysphagia with solids.     Location of  Abdominal  pain: RUQ.    Denies dysphagia with liquids, heartburn, loss of appetite, nausea, vomiting, vomiting blood, weight loss, and  weight gain.        Denies anal fissure, black tarry stools, change in bowel habit, constipation, diarrhea, diverticulosis, fecal incontinence, heme positive stool, hemorrhoids, irritable bowel syndrome, jaundice, light color stool, liver problems, rectal bleeding, and  rectal pain. Preventive Screening-Counseling & Management  Alcohol-Tobacco     Smoking Status: never      Drug Use:  no.      Current Medications (verified): 1)  Lescol Xl 80 Mg Tb24 (Fluvastatin Sodium) .... Take One By Mouth At Bedtime 2)  Toprol Xl 50 Mg  Tb24 (Metoprolol Succinate) .... Take 1 1/2 Tablets By Mouth Once A Day  Allergies (verified): 1)  ! Amoxicillin (Amoxicillin)  Past History:  Past medical, surgical, family and social histories (including risk factors) reviewed for relevance to current acute and chronic problems.  Past Medical History: Hypertension: ( 03/2005) Anxiety Disorder  Past Surgical History: Reviewed history from 10/07/2007 and no changes required. LIVER BIOPSY-- FATTY LIVER:(11/O2/1999)  Family History: Reviewed history from 05/30/2009 and no changes required. Father:A 70  HTN; HEART DISEASE ; DM  Mother: :A 62  ANXIETY, HTN; DM; DEPRESSION SISTER A 35 ANXIETY CV:+ FATHER(MI);  HBP:+MOTHER; + FATHER; +PGM;+PGF; +MGM; +MGF DM: +FATHER; + MOTHER GOUT/ARTHRITIS: PROSTATE CANCER:--           ?MGF BREAST/OVARIAN/UTERINE /CANCER: COLON CANCER: NEGATIVE DEPRESSION: +MOTHER; +MUNCLE; +MGM; +MGGF ETOH ABUSE: + MOTHERS SIDE OTHER:+ STROKE ? ?SKIN CANCER IN UNCLE?  Social History: Reviewed history from 10/07/2007 and no changes required. Marital Status: MarriedLIVES WITH WIFE Children: NONE  Occupation: [employer] Patient has never smoked.  Alcohol Use - no Daily Caffeine Use -4 Illicit Drug Use - no  Review of  Systems       The patient complains of back pain, fatigue, headaches-new, itching, muscle pains/cramps, and shortness of breath.  The patient denies allergy/sinus, anemia, anxiety-new, arthritis/joint pain, blood in urine, breast changes/lumps, change in vision, confusion, cough, coughing up blood, depression-new, fainting, fever, hearing problems, heart murmur, heart rhythm changes, menstrual pain, night sweats, nosebleeds, pregnancy symptoms, skin rash, sleeping problems, sore throat, swelling of feet/legs, swollen lymph glands, thirst - excessive , urination - excessive , urination changes/pain, urine leakage, vision changes, and voice change.   General:  Complains of fatigue; He has mild fatigue but continues to work daily and exercises fairly regularly.. Eyes:  Denies blurring, diplopia, irritation, discharge, vision loss, scotoma, eye pain, and photophobia. ENT:  Denies earache, ear discharge, tinnitus, decreased hearing, nasal congestion, loss of smell, nosebleeds, sore throat, hoarseness, and difficulty swallowing. CV:  Complains of dyspnea on exertion; denies chest pains, angina, palpitations, syncope, orthopnea, PND, peripheral edema, and claudication. Resp:  Complains of dyspnea with exercise; denies dyspnea at rest, cough, sputum, wheezing, coughing up blood, and pleurisy. GI:  Complains of difficulty swallowing and pain on swallowing; denies nausea, indigestion/heartburn, vomiting, vomiting blood, abdominal pain, jaundice, gas/bloating, diarrhea, constipation, change in bowel habits, bloody BM's, black BMs, and fecal incontinence. GU:  Denies urinary burning, blood in urine, urinary frequency, urinary hesitancy, nocturnal urination, urinary incontinence, penile discharge, genital sores, decreased libido, and erectile dysfunction. MS:  Complains of muscle cramps; denies joint pain / LOM, joint swelling, joint stiffness, joint deformity, low back pain, muscle weakness, muscle atrophy, leg pain  at night, leg pain with exertion, and shoulder pain / LOM hand / wrist pain (CTS). Derm:  Complains of itching; denies rash, dry skin, hives, moles, warts, and unhealing ulcers. Neuro:  Complains of headache. Psych:  Denies depression, anxiety, memory loss, suicidal ideation, hallucinations, paranoia, phobia, and confusion. Endo:  Denies cold intolerance, heat intolerance, polydipsia, polyphagia, polyuria, unusual weight change, and hirsutism. Heme:  Denies bruising, bleeding, enlarged lymph nodes, and pagophagia. Allergy:  Denies hives, rash, sneezing, hay fever, and recurrent infections.  Vital Signs:  Patient profile:   40 year old male Height:      65 inches Weight:      208.38 pounds BMI:     34.80 Pulse rate:   76 / minute Pulse rhythm:   regular BP sitting:   162 / 100  (left arm) Cuff size:   regular  Vitals Entered By: June McMurray CMA Duncan Dull) (March 08, 2010 8:22 AM)  Physical Exam  General:  Well developed, well nourished, no acute distress.healthy appearing and obese.   Head:  Normocephalic and atraumatic. Eyes:  PERRLA, no icterus.exam deferred to patient's ophthalmologist.   Mouth:  No deformity or lesions, dentition normal. Neck:  Supple; no masses or thyromegaly. Lungs:  Clear throughout to auscultation. Heart:  Regular rate and rhythm; no murmurs, rubs,  or bruits. Abdomen:  Soft, nontender and nondistended. No masses, hepatosplenomegaly or hernias noted. Normal bowel sounds. Msk:  Symmetrical with no gross deformities. Normal posture.No swollen joints or obvious skin rashes noted. There is no evidence of edema or phlebitis. Pulses:  Normal pulses noted. Extremities:  No clubbing, cyanosis, edema or deformities noted. Neurologic:  Alert and  oriented x4;  grossly normal neurologically.No asterixis noted. Cervical Nodes:  No significant cervical adenopathy. Psych:  Alert and  cooperative. Normal mood and affect.   Impression & Recommendations:  Problem # 1:   ABDOMINAL PAIN RIGHT UPPER QUADRANT (ICD-789.01) Assessment Improved Fatty Liver with capsular distention of his liver causing abdominal pain. 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 We will obtain his previous records for review. I suspect he will need followup liver biopsy. In the interim, I have referred him to dietary for weight loss counseling and have urged him to start daily aerobic exercise and a gradual elevated program. Lipid profile also has been ordered. Hemoglobin A1c has been ordered. Other labs to exclude other causes of metabolic liver disease or previous hepatitis also ordered. Long discussion today concerning fatty liver, Nash syndrome, weight loss, and possible bariatric surgery for this condition and his other metabolic problems.He Does not use ethanol at all. He is very intelligent and understands all of the above problems, implications, and risk to his health. Orders: TLB-CBC Platelet - w/Differential (85025-CBCD) TLB-BMP (Basic Metabolic Panel-BMET) (80048-METABOL) TLB-Hepatic/Liver Function Pnl (80076-HEPATIC) TLB-TSH (Thyroid Stimulating Hormone) (84443-TSH) TLB-B12, Serum-Total ONLY (16109-U04) TLB-Ferritin (82728-FER) TLB-Folic Acid (Folate) (82746-FOL) TLB-IBC Pnl (Iron/FE;Transferrin) (83550-IBC) TLB-Lipid Panel (80061-LIPID) TLB-IgA (Immunoglobulin A) (82784-IGA) T-AMA (54098-11914) T-Anti SMA (78295-62130) T-Ceruloplasmin (86578-46962) T-Hepatitis B Surface Antigen [id number]) T-Hepatitis C Anti HCV (01027) T-Sprue Panel (Celiac Disease Aby Eval) (83516x3/[address]) TLB-A1C / Hgb A1C (Glycohemoglobin) (83036-A1C)  Problem # 2:  FATTY LIVER DISEASE (ICD-571.8) Assessment: Deteriorated Exclude other causes of chronic elevated liver function test and consider followup liver biopsy. Orders: TLB-CBC Platelet - w/Differential (85025-CBCD) TLB-BMP (Basic  Metabolic Panel-BMET) (80048-METABOL) TLB-Hepatic/Liver Function Pnl (80076-HEPATIC) TLB-TSH (Thyroid Stimulating Hormone) (84443-TSH) TLB-B12, Serum-Total ONLY (25366-Y40) TLB-Ferritin (82728-FER) TLB-Folic Acid (Folate) (82746-FOL) TLB-IBC Pnl (Iron/FE;Transferrin) (83550-IBC) TLB-Lipid Panel (80061-LIPID) TLB-IgA (Immunoglobulin A) (82784-IGA) T-AMA (34742-59563) T-Anti SMA (87564-33295) T-Ceruloplasmin (18841-66063) T-Hepatitis B Surface Antigen (01601-09323) T-Hepatitis C Anti HCV (55732) T-Sprue Panel (Celiac Disease Aby Eval) (83516x3/[address]) TLB-A1C / Hgb A1C (Glycohemoglobin) (83036-A1C)  Problem # 3:  DYSPHAGIA (KGU-542.70) Assessment: Unchanged Periodic solid food dysphagia of a rather severe nature consistent with probable Schatzki's ring the distal esophagus. He has minimal chronic reflux symptoms. Endoscopy with dilatation has been scheduled at his convenience, and at that time we will check him for esophageal varices.  Problem # 4:  OBESITY, UNSPECIFIED (ICD-278.00) Assessment: Deteriorated BMI is elevated at 35 which would put him in the except both category for bariatric surgery if needed. Hopefully he will be able to lose weight voluntarily. I am concerned about possibility of advancing liver disease in this patient with the eventual development of cirrhosis. Apparently he has had elevated liver function test for greater than 10 years. Once I see his labs I will give consideration to Actos therapy for his fatty liver. I have advised him to take vitamin E. supplementation.  Patient Instructions: 1)  Please go to the basement for lab work. 2)  You will be referred to Dietary teaching.  You will be contacted later with an appointment. 3)  You are scheduled for an upper endoscopy. 4)  The medication list was reviewed and reconciled.  All changed / newly prescribed medications were explained.  A complete medication list was provided to the patient / caregiver. 5)  Copy  sent to : Dr. Laurita Quint and [facility] dietary division. 6)  Please continue current medications.  7)  Conscious Sedation brochure given.  8)  Upper Endoscopy with Dilatation brochure given.  9)  Fatty Liver handout  given.  10)  Please schedule a follow-up appointment in 1 month.   Appended Document: RIGHT UPPER QUAD PAIN...AS.    Clinical Lists Changes  Orders: Added new Test order of EGD (EGD) - Signed Added new Referral order of Nutrition Referral (Nutrition) - Signed      Appended Document: RIGHT UPPER QUAD PAIN...AS.    Clinical Lists Changes  Orders: Added new Test order of [facility] ([facility]) - Signed

## 2010-09-25 NOTE — Progress Notes (Signed)
Summary: Aciphex denial  Phone Note Outgoing Call   Call placed by: Ashok Cordia RN,  April 09, 2010 11:29 AM Summary of Call: LM for pt to call re Aciphex.  Insurance will not cover.  Pt must try lansoprazole, omeprazole and pantoprazole first.  See paper work from [place].  Pt has appt this week. Initial call taken by: Ashok Cordia RN,  April 09, 2010 11:30 AM  Follow-up for Phone Call        talked with Pt/ He will discus change in med at OV. Follow-up by: Ashok Cordia RN,  April 09, 2010 3:44 PM

## 2010-10-16 ENCOUNTER — Encounter: Payer: Self-pay | Admitting: Family Medicine

## 2010-10-16 DIAGNOSIS — E119 Type 2 diabetes mellitus without complications: Secondary | ICD-10-CM | POA: Insufficient documentation

## 2010-10-16 DIAGNOSIS — F419 Anxiety disorder, unspecified: Secondary | ICD-10-CM

## 2010-10-16 DIAGNOSIS — E1129 Type 2 diabetes mellitus with other diabetic kidney complication: Secondary | ICD-10-CM | POA: Insufficient documentation

## 2010-10-16 DIAGNOSIS — E785 Hyperlipidemia, unspecified: Secondary | ICD-10-CM

## 2010-10-16 DIAGNOSIS — K449 Diaphragmatic hernia without obstruction or gangrene: Secondary | ICD-10-CM | POA: Insufficient documentation

## 2010-10-16 DIAGNOSIS — K219 Gastro-esophageal reflux disease without esophagitis: Secondary | ICD-10-CM | POA: Insufficient documentation

## 2010-10-16 DIAGNOSIS — I10 Essential (primary) hypertension: Secondary | ICD-10-CM

## 2010-10-16 DIAGNOSIS — K222 Esophageal obstruction: Secondary | ICD-10-CM

## 2010-11-21 IMAGING — US US ABDOMEN COMPLETE
1 series · 14 of 25 positions shown · non-contrast
Comparison: None.

CLINICAL DATA: Right upper quadrant abdominal pain for 2 weeks

COMPLETE ABDOMINAL ULTRASOUND

[Series 1: us abdomen complete · 0.30mm/px · 14 of 57 slices shown]
[im 1/57]
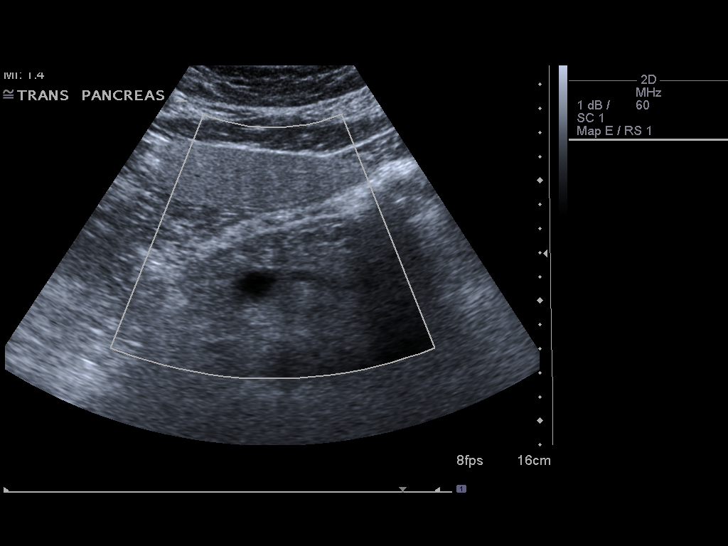
[im 5/57]
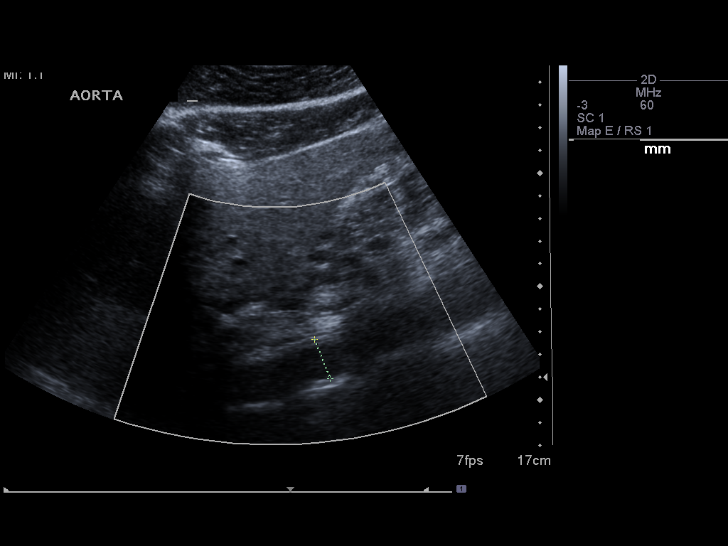
[im 10/57]
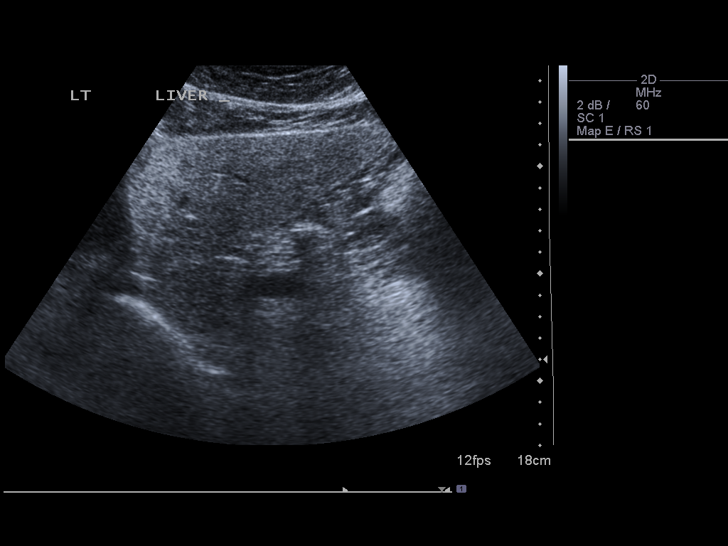
[im 15/57]
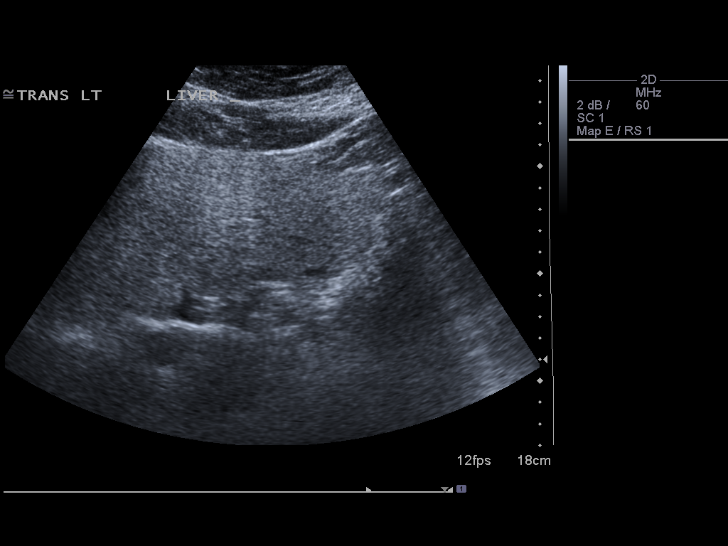
[im 19/57]
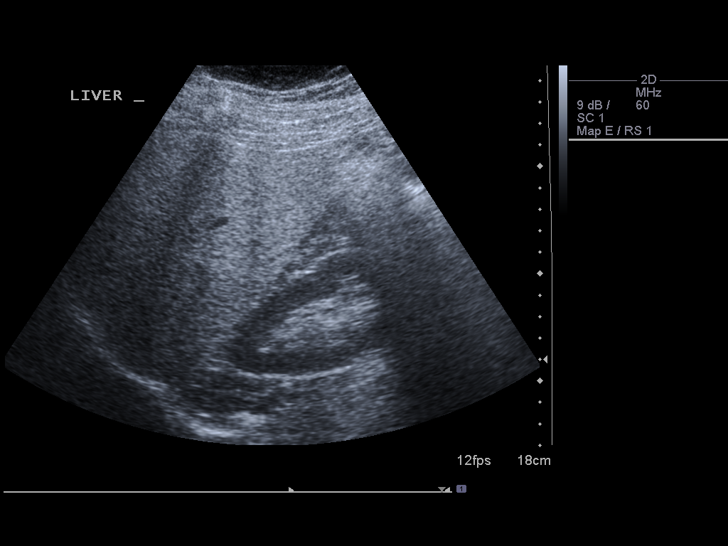
[im 22/57]
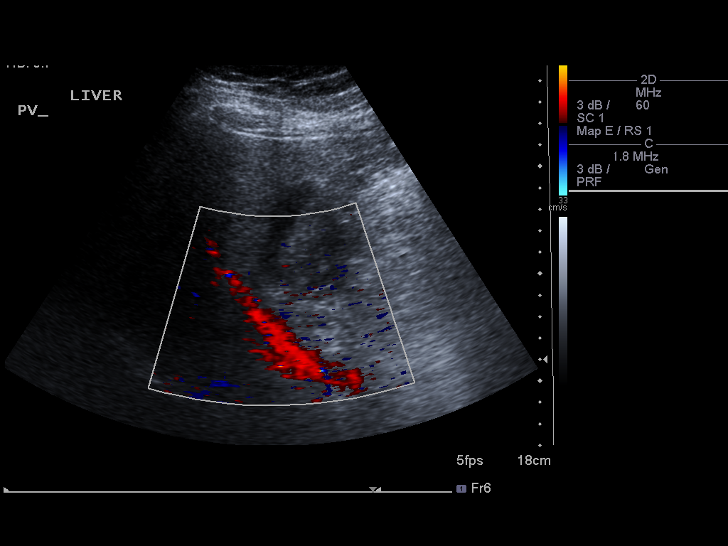
[im 26/57]
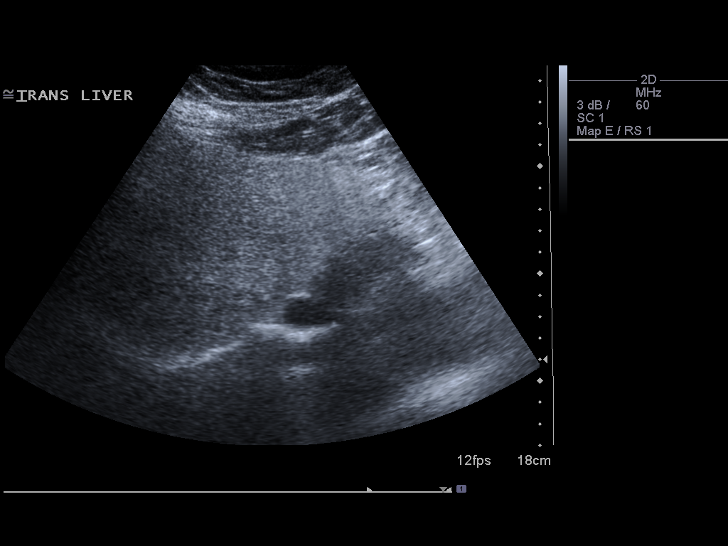
[im 31/57]
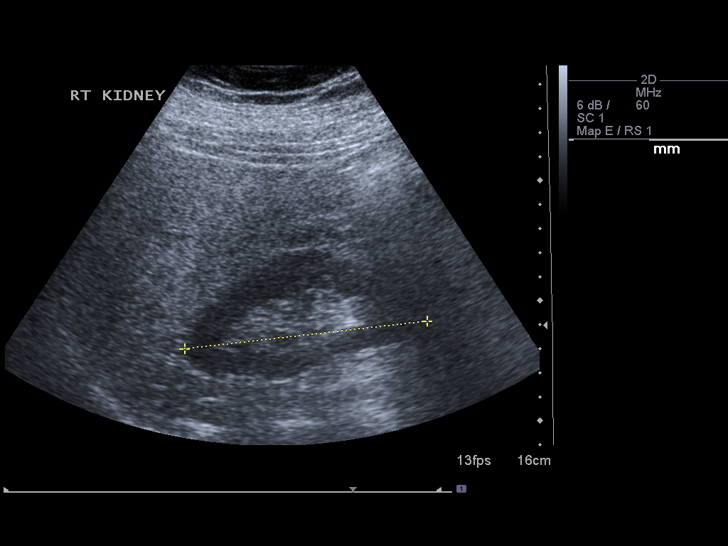
[im 36/57]
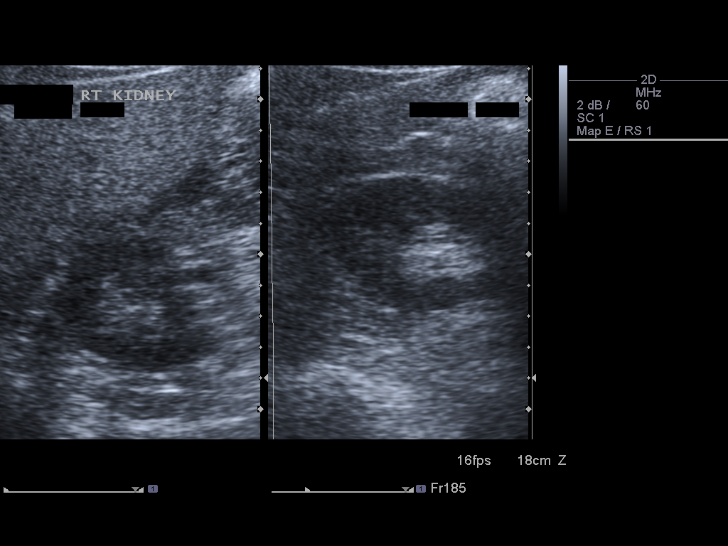
[im 38/57]
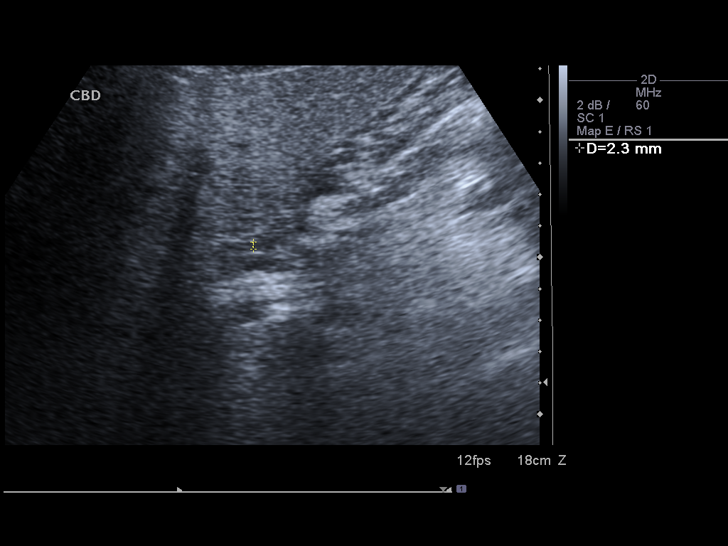
[im 43/57]
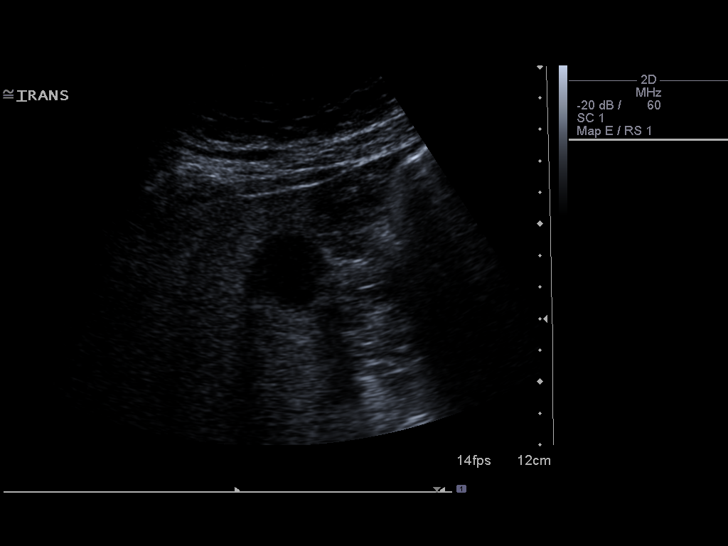
[im 47/57]
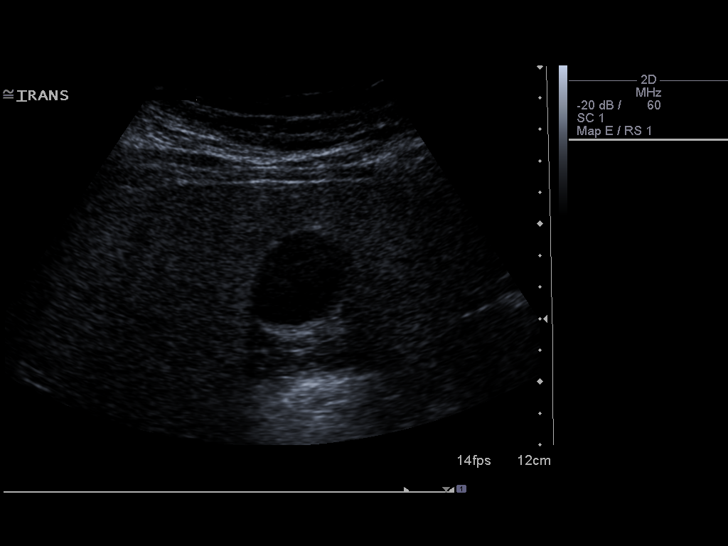
[im 52/57]
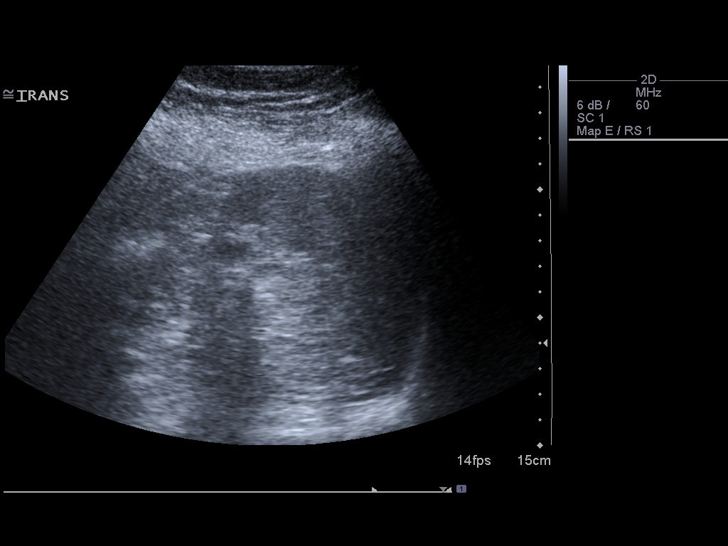
[im 57/57]
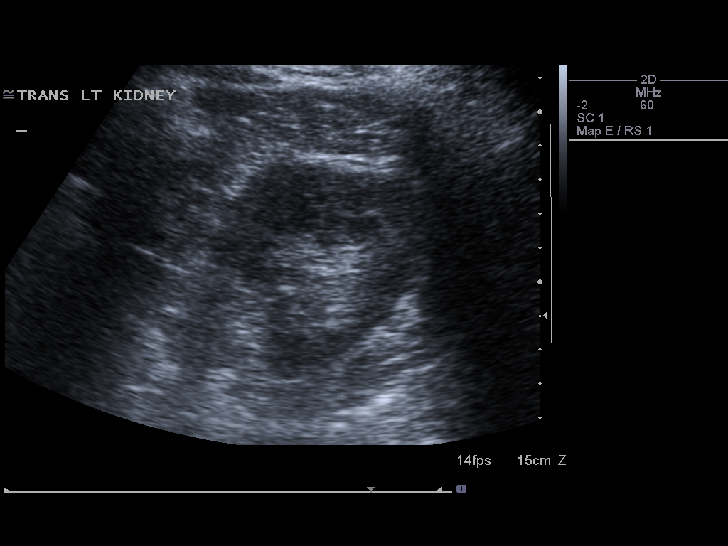

[14 of 25 positions shown; findings below may reference images not displayed]

FINDINGS: Gallbladder:  The gallbladder is visualized and no gallstones are
noted.  There is no pain over the gallbladder with compression.

Common bile duct:  The common bile duct is normal measuring 2.3 mm
in diameter.

Liver:  The liver is echogenic consistent with fatty infiltration.
No focal abnormality is seen.

IVC:  Appears normal.

Pancreas:  No focal abnormality seen.

Spleen:  The spleen is normal measuring 5.4 cm sagittally.

Right Kidney:  No hydronephrosis is seen.  The right kidney
measures 10.2 cm sagittally.

Left Kidney:  No hydronephrosis.  The left kidney measures 10.2 cm.

Abdominal aorta:  The abdominal aorta is normal in caliber.
IMPRESSION: 1.  Probable fatty infiltration of the liver.  No ductal
dilatation.
2.  No gallstones.

## 2010-11-29 ENCOUNTER — Other Ambulatory Visit: Payer: Self-pay | Admitting: Family Medicine

## 2010-12-03 ENCOUNTER — Other Ambulatory Visit (INDEPENDENT_AMBULATORY_CARE_PROVIDER_SITE_OTHER): Payer: Managed Care, Other (non HMO) | Admitting: Family Medicine

## 2010-12-03 DIAGNOSIS — E119 Type 2 diabetes mellitus without complications: Secondary | ICD-10-CM

## 2010-12-06 ENCOUNTER — Ambulatory Visit (INDEPENDENT_AMBULATORY_CARE_PROVIDER_SITE_OTHER): Payer: Managed Care, Other (non HMO) | Admitting: Family Medicine

## 2010-12-06 ENCOUNTER — Encounter: Payer: Self-pay | Admitting: Family Medicine

## 2010-12-06 DIAGNOSIS — E119 Type 2 diabetes mellitus without complications: Secondary | ICD-10-CM

## 2010-12-06 DIAGNOSIS — I10 Essential (primary) hypertension: Secondary | ICD-10-CM

## 2010-12-06 NOTE — Assessment & Plan Note (Signed)
Pt to call GI for fu.

## 2010-12-06 NOTE — Progress Notes (Signed)
Chest cold last few weeks, now resolved.  Feeling well otherwise.  Diabetes:  No meds Labs reviewed with patient Hypoglycemic episodes:no Hyperglycemic episodes:no Feet problems:no Blood Sugars averaging:no checked Exercising 86min/day  Hypertension:    Using medication without problems: yes Chest pain with exertion:no Edema:no Short of breath:no  Hemachromatosis- will scheduled fu with Dr. Jarold Motto.    PMH and SH reviewed  Meds, vitals, and allergies reviewed.   ROS: See HPI.  Otherwise negative.    GEN: nad, alert and oriented HEENT: mucous membranes moist NECK: supple w/o LA CV: rrr. PULM: ctab, no inc wob ABD: soft, +bs EXT: no edema SKIN: no acute rash  Diabetic foot exam: Normal inspection No skin breakdown No calluses  Normal DP pulses Normal sensation to light tough and monofilament Nails normal

## 2010-12-06 NOTE — Patient Instructions (Signed)
Don't change your meds.  Call Dr. Jarold Motto for an appointment.  Keep working on your diet and weight.  Take care.  Physical fall 2012.

## 2010-12-06 NOTE — Assessment & Plan Note (Signed)
Diet controlled, labs/diet/weight d/w pt.  Continue to work on diet and recheck at CPE in fall 2012.

## 2010-12-06 NOTE — Assessment & Plan Note (Signed)
Controlled, no change in meds.  Recheck at CPE in fall 2012.

## 2010-12-20 ENCOUNTER — Ambulatory Visit (INDEPENDENT_AMBULATORY_CARE_PROVIDER_SITE_OTHER): Payer: Managed Care, Other (non HMO) | Admitting: Gastroenterology

## 2010-12-20 ENCOUNTER — Encounter: Payer: Self-pay | Admitting: Gastroenterology

## 2010-12-20 DIAGNOSIS — D649 Anemia, unspecified: Secondary | ICD-10-CM

## 2010-12-20 DIAGNOSIS — K7689 Other specified diseases of liver: Secondary | ICD-10-CM

## 2010-12-20 DIAGNOSIS — R1013 Epigastric pain: Secondary | ICD-10-CM

## 2010-12-20 DIAGNOSIS — R198 Other specified symptoms and signs involving the digestive system and abdomen: Secondary | ICD-10-CM

## 2010-12-20 DIAGNOSIS — K760 Fatty (change of) liver, not elsewhere classified: Secondary | ICD-10-CM

## 2010-12-20 DIAGNOSIS — R634 Abnormal weight loss: Secondary | ICD-10-CM

## 2010-12-20 DIAGNOSIS — K219 Gastro-esophageal reflux disease without esophagitis: Secondary | ICD-10-CM

## 2010-12-20 MED ORDER — ONDANSETRON HCL 8 MG PO TABS: 8.0000 mg | ORAL_TABLET | Freq: Three times a day (TID) | ORAL | Status: AC | PRN

## 2010-12-20 MED ORDER — LANSOPRAZOLE 30 MG PO CPDR: 30.0000 mg | DELAYED_RELEASE_CAPSULE | Freq: Two times a day (BID) | ORAL | Status: DC

## 2010-12-20 MED ORDER — PEG-KCL-NACL-NASULF-NA ASC-C 100 G PO SOLR: 1.0000 | Freq: Once | ORAL | Status: AC

## 2010-12-20 MED ORDER — SUCRALFATE 1 GM/10ML PO SUSP: ORAL | Status: DC

## 2010-12-20 NOTE — Progress Notes (Signed)
Addended by: Harlow Mares on: 12/20/2010 09:50 AM   Modules accepted: Orders

## 2010-12-20 NOTE — Patient Instructions (Signed)
Patient will call back when he needs rx refill.

## 2010-12-20 NOTE — Progress Notes (Signed)
This is a extremely pleasant oriented to year-old white male with fatty liver and he is a heterozygote for hemachromatosis. 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 He currently is on Prevacid 30 mg a day and is asymptomatic without reflux symptoms or dysphagia. He has regular bowel movements but did have some hemorrhoidal bleeding in August. Family history is noncontributory for colon polyps, carcinoma, or hemachromatosis. The patient does have borderline diabetes managed by dietary therapy. He has mildly increased serum transaminases which actually are improved a year ago, and serum ferritin level is 200.  Current Medications, Allergies, Past Medical History, Past Surgical History, Family History and Social History were reviewed in [employer] record.  Pertinent Review of Systems Negative.. no arthritis problems, neurological issues or cardiovascular or pulmonary complaints. Also denies any genitourinary symptoms.   Physical Exam: awake alert and in no acute distress no stigmata of chronic liver disease. Chest is clear he has a regular rhythm no murmurs gallops or rubs. Abdominal exam shows no organomegaly, masses or tenderness. Mental status is clear peripheral extremities are unremarkable.    Assessment and Plan: 1 GERD doing well on daily PPI therapy. Fatty liver seems to be doing better with weight control , and I have reviewed a tear measures and an exercise program with the patient. Not abuse alcohol or cigarettes. I've advised portion size control and aerobic exercise programs. Would advise continuing yearly ferritin and liver function tests per primary care. He is to call if he has recurrent dysphagia. Encounter Diagnoses  Name Primary?  Marland Kitchen Anemia   . Epigastric pain   . Weight loss   . Change in bowel function

## 2010-12-25 ENCOUNTER — Encounter: Payer: Self-pay | Admitting: Family Medicine

## 2010-12-28 ENCOUNTER — Telehealth: Payer: Self-pay | Admitting: Gastroenterology

## 2010-12-28 NOTE — Telephone Encounter (Signed)
carafate and zofran were called in for patient and they were entered in the wrong chart i have called patient and pharmacy and dced rxs.

## 2011-02-19 ENCOUNTER — Other Ambulatory Visit: Payer: Self-pay | Admitting: *Deleted

## 2011-02-19 NOTE — Telephone Encounter (Signed)
Forms from [facility] are on your desk.  They are asking for new scripts for lescol, metoprolol.

## 2011-02-19 NOTE — Telephone Encounter (Signed)
I'll address the hard copy when I return to the office.

## 2011-02-20 MED ORDER — FLUVASTATIN SODIUM ER 80 MG PO TB24: 80.0000 mg | ORAL_TABLET | Freq: Every day | ORAL | Status: DC

## 2011-02-20 MED ORDER — METOPROLOL SUCCINATE ER 50 MG PO TB24: 75.0000 mg | ORAL_TABLET | Freq: Every day | ORAL | Status: DC

## 2011-02-20 NOTE — Telephone Encounter (Signed)
Rx's called to [facility]. 

## 2011-02-20 NOTE — Telephone Encounter (Signed)
Hard copy is in my outbox.

## 2011-02-22 ENCOUNTER — Other Ambulatory Visit: Payer: Self-pay | Admitting: *Deleted

## 2011-02-22 NOTE — Telephone Encounter (Signed)
90 day supply Rx's requested.  Form in your IN box.

## 2011-02-24 NOTE — Telephone Encounter (Signed)
This should already have been done, see prev notes.

## 2011-02-24 NOTE — Telephone Encounter (Signed)
See prev notes

## 2011-02-26 MED ORDER — METOPROLOL SUCCINATE ER 50 MG PO TB24: 75.0000 mg | ORAL_TABLET | Freq: Every day | ORAL | Status: DC

## 2011-02-26 MED ORDER — FLUVASTATIN SODIUM ER 80 MG PO TB24: 80.0000 mg | ORAL_TABLET | Freq: Every day | ORAL | Status: DC

## 2011-02-26 NOTE — Telephone Encounter (Signed)
Rxs sent electronically.  

## 2011-05-09 ENCOUNTER — Telehealth: Payer: Self-pay | Admitting: *Deleted

## 2011-05-09 DIAGNOSIS — E119 Type 2 diabetes mellitus without complications: Secondary | ICD-10-CM

## 2011-05-09 NOTE — Telephone Encounter (Signed)
Pt went to [place] [place] urgent care on Sunday with BP of 180/102.  They increased his metoprolol to twice a day dosing.  He is asking if he needs to come in for follow up.  He has a physical with you next month.  His BP was back down to usual yesterday, 118/83.

## 2011-05-17 NOTE — Telephone Encounter (Signed)
Keep the appointment with me. If BP is in normal range, then no further changes in meds in the meantime.  If he has CP, SOB, BLE edema, feeling faint, then the needs to notify the clinic.  Orders are in for fasting labs ahead of the CPE.  Thanks.

## 2011-05-20 NOTE — Telephone Encounter (Signed)
Left message on machine to call back  

## 2011-05-21 NOTE — Telephone Encounter (Signed)
Left message on machine at home and voicemail to call back.

## 2011-05-21 NOTE — Telephone Encounter (Signed)
Patient notified as instructed by telephone. Patient stated that his BP has been fine since the change in his medication. Lab appointment already scheduled.

## 2011-06-05 ENCOUNTER — Other Ambulatory Visit (INDEPENDENT_AMBULATORY_CARE_PROVIDER_SITE_OTHER): Payer: BC Managed Care – PPO

## 2011-06-05 DIAGNOSIS — E119 Type 2 diabetes mellitus without complications: Secondary | ICD-10-CM

## 2011-06-05 LAB — CBC WITH DIFFERENTIAL/PLATELET
Basophils Absolute: 0 10*3/uL (ref 0.0–0.1)
Basophils Relative: 0.5 % (ref 0.0–3.0)
Eosinophils Absolute: 0.4 10*3/uL (ref 0.0–0.7)
HCT: 47 % (ref 39.0–52.0)
Hemoglobin: 16.1 g/dL (ref 13.0–17.0)
Lymphs Abs: 2.5 10*3/uL (ref 0.7–4.0)
MCHC: 34.2 g/dL (ref 30.0–36.0)
MCV: 90.1 fl (ref 78.0–100.0)
Monocytes Absolute: 0.4 10*3/uL (ref 0.1–1.0)
Neutro Abs: 2.8 10*3/uL (ref 1.4–7.7)
RBC: 5.22 Mil/uL (ref 4.22–5.81)
RDW: 12.9 % (ref 11.5–14.6)

## 2011-06-05 LAB — MICROALBUMIN / CREATININE URINE RATIO
Creatinine,U: 242.2 mg/dL
Microalb Creat Ratio: 0.4 mg/g (ref 0.0–30.0)

## 2011-06-05 LAB — LIPID PANEL
LDL Cholesterol: 84 mg/dL (ref 0–99)
Total CHOL/HDL Ratio: 4
Triglycerides: 111 mg/dL (ref 0.0–149.0)

## 2011-06-05 LAB — COMPREHENSIVE METABOLIC PANEL
ALT: 75 U/L — ABNORMAL HIGH (ref 0–53)
AST: 41 U/L — ABNORMAL HIGH (ref 0–37)
Alkaline Phosphatase: 87 U/L (ref 39–117)
BUN: 15 mg/dL (ref 6–23)
Calcium: 9 mg/dL (ref 8.4–10.5)
Chloride: 103 mEq/L (ref 96–112)
Creatinine, Ser: 0.8 mg/dL (ref 0.4–1.5)
Total Bilirubin: 1 mg/dL (ref 0.3–1.2)

## 2011-06-05 LAB — HEMOGLOBIN A1C: Hgb A1c MFr Bld: 6.4 % (ref 4.6–6.5)

## 2011-06-10 ENCOUNTER — Encounter: Payer: Self-pay | Admitting: Family Medicine

## 2011-06-10 ENCOUNTER — Ambulatory Visit (INDEPENDENT_AMBULATORY_CARE_PROVIDER_SITE_OTHER): Payer: BC Managed Care – PPO | Admitting: Family Medicine

## 2011-06-10 VITALS — BP 138/78 | HR 61 | Temp 98.5°F | Wt 199.1 lb

## 2011-06-10 DIAGNOSIS — I10 Essential (primary) hypertension: Secondary | ICD-10-CM

## 2011-06-10 DIAGNOSIS — E7800 Pure hypercholesterolemia, unspecified: Secondary | ICD-10-CM

## 2011-06-10 DIAGNOSIS — E119 Type 2 diabetes mellitus without complications: Secondary | ICD-10-CM

## 2011-06-10 DIAGNOSIS — Z0000 Encounter for general adult medical examination without abnormal findings: Secondary | ICD-10-CM

## 2011-06-10 DIAGNOSIS — K7689 Other specified diseases of liver: Secondary | ICD-10-CM

## 2011-06-10 NOTE — Assessment & Plan Note (Signed)
Routine anticipatory guidance given to patient. See health maintenance. He'll get flu shot at work. Up to date on tetanus. Not indicated for prostate or colon CA screening. We talked about exercise and diet.

## 2011-06-10 NOTE — Assessment & Plan Note (Signed)
Labs dw pt, continue to work on diet and weight.

## 2011-06-10 NOTE — Progress Notes (Signed)
CPE- See plan.  Routine anticipatory guidance given to patient.  See health maintenance.  He'll get flu shot at work.  Up to date on tetanus.  Not indicated for prostate or colon CA screening. We talked about exercise and diet.   Diabetes:  No meds Hypoglycemic episodes:no Hyperglycemic episodes:none known Feet problems:no Blood Sugars averaging: not checked eye exam within last year: he's due and he'll check on this.   He's working on diet, but "it's hard. My wife can eat crap and her sugar is fine.  My vice is bread."    Hypertension:    Using medication without problems or lightheadedness: occ lightheadedness, this is rare Chest pain with exertion: no Edema: no Short of breath:no Average home BPs: 110s/80s on home checks.    Elevated Cholesterol: Using medications without problems: yes, but see below about the aches Muscle aches: yes, backaches  Diet compliance:yes Exercise: a day walking, jumpjacks and pushups, usually 7 days a week Other complaints: HA and muscle pains, but only on the R side of body  PMH and SH reviewed.   Vital signs, Meds and allergies reviewed.  ROS: See HPI.  Otherwise nontributory.   GEN: nad, alert and oriented HEENT: mucous membranes moist NECK: supple w/o LA CV: rrr PULM: ctab, no inc wob ABD: soft, +bs EXT: no edema SKIN: no acute rash  Diabetic foot exam: Normal inspection No skin breakdown No calluses  Normal DP pulses Normal sensation to light tough and monofilament Nails normal

## 2011-06-10 NOTE — Assessment & Plan Note (Signed)
Labs d/w pt, continue meds and work on diet, exercise.

## 2011-06-10 NOTE — Assessment & Plan Note (Signed)
Hold statin and see if aches improve. He agrees. Call back with update.

## 2011-06-10 NOTE — Patient Instructions (Signed)
Stop the cholesterol medicine and call me with an update in about 2 weeks.  Let me know if the muscle aches are any better.   I would get a flu shot each fall.  Keep exercising and working on your diet.  Recheck labs in 6 months ahead of OV.   Glad to see you today.

## 2011-06-10 NOTE — Assessment & Plan Note (Signed)
Labs d/w pt, no meds now and work on diet, exercise. He agrees.

## 2011-06-13 ENCOUNTER — Telehealth: Payer: Self-pay | Admitting: *Deleted

## 2011-06-13 NOTE — Telephone Encounter (Signed)
Thanks for update.  I would try pravastatin 40mg  po qhs.  Less likely to cause aches.  Please send in rx for #90 and 3RF (and add to EMR) if pt is willing to try.  If aches return, then cut the dose in half, down to 20mg .  If still with aches at that point, stop the medicine and notify us.  Thanks.

## 2011-06-13 NOTE — Telephone Encounter (Signed)
Patient's wife answered the phone.  He is asleep.  She will ask him to return to call tomorrow.

## 2011-06-13 NOTE — Telephone Encounter (Signed)
Pt called to report that all of his aches and pains are gone, since stopping lescol on Monday.

## 2011-06-19 ENCOUNTER — Encounter: Payer: Self-pay | Admitting: Family Medicine

## 2011-06-19 ENCOUNTER — Other Ambulatory Visit: Payer: Self-pay | Admitting: *Deleted

## 2011-06-19 MED ORDER — PRAVASTATIN SODIUM 40 MG PO TABS: 40.0000 mg | ORAL_TABLET | Freq: Every evening | ORAL | Status: DC

## 2011-06-19 NOTE — Telephone Encounter (Signed)
Spoke with patient.  Medication sent to pharmacy

## 2011-12-09 ENCOUNTER — Other Ambulatory Visit (INDEPENDENT_AMBULATORY_CARE_PROVIDER_SITE_OTHER): Payer: BC Managed Care – PPO

## 2011-12-09 DIAGNOSIS — E119 Type 2 diabetes mellitus without complications: Secondary | ICD-10-CM

## 2011-12-09 LAB — HEPATIC FUNCTION PANEL
ALT: 78 U/L — ABNORMAL HIGH (ref 0–53)
Total Bilirubin: 0.9 mg/dL (ref 0.3–1.2)
Total Protein: 7.2 g/dL (ref 6.0–8.3)

## 2011-12-13 ENCOUNTER — Ambulatory Visit (INDEPENDENT_AMBULATORY_CARE_PROVIDER_SITE_OTHER): Payer: BC Managed Care – PPO | Admitting: Family Medicine

## 2011-12-13 ENCOUNTER — Encounter: Payer: Self-pay | Admitting: Family Medicine

## 2011-12-13 VITALS — BP 122/78 | HR 54 | Temp 98.3°F | Wt 197.0 lb

## 2011-12-13 DIAGNOSIS — E7800 Pure hypercholesterolemia, unspecified: Secondary | ICD-10-CM

## 2011-12-13 DIAGNOSIS — E119 Type 2 diabetes mellitus without complications: Secondary | ICD-10-CM

## 2011-12-13 DIAGNOSIS — I10 Essential (primary) hypertension: Secondary | ICD-10-CM

## 2011-12-13 DIAGNOSIS — N509 Disorder of male genital organs, unspecified: Secondary | ICD-10-CM

## 2011-12-13 DIAGNOSIS — N50819 Testicular pain, unspecified: Secondary | ICD-10-CM

## 2011-12-13 MED ORDER — PRAVASTATIN SODIUM 40 MG PO TABS: 20.0000 mg | ORAL_TABLET | Freq: Every evening | ORAL | Status: DC

## 2011-12-13 NOTE — Patient Instructions (Addendum)
Schedule a physical in 6 months with fasting labs ahead of time.  Keep exercising and working on your weight.  Your labs look okay.  If you have continued symptoms, then notify me.   Cut back to 20mg  of pravastatin.

## 2011-12-13 NOTE — Progress Notes (Signed)
Diet controlled DM2 with A1c 6.5.  He's working on low carb diet and exercising.  Labs d/w pt.    Elevated Cholesterol: Using medications without problems:yes Muscle aches: rarely; after 3 months of use he'll get some aches.  Resolved after a few days off the medicine.  Restarted now.  Diet compliance:yes Exercise: yes, 7 days a week walking Other complaints: intentionally losing weight.    Hypertension:    Using medication without problems or lightheadedness: yes Chest pain with exertion:no Edema:no Short of breath:no  H/o hemachromatosis and labs d/w pt. No FCNAVD, no abd pain.    He's had some episodes recently of uncomfortable testicle elevation on the L.  No pain now, no masses.  No trauma.    Meds, vitals, and allergies reviewed.   PMH and SH reviewed  ROS: See HPI.  Otherwise negative.    GEN: nad, alert and oriented HEENT: mucous membranes moist NECK: supple w/o LA CV: rrr. PULM: ctab, no inc wob ABD: soft, +bs EXT: no edema SKIN: no acute rash Testes bilaterally descended without nodularity, tenderness or masses. No scrotal masses or lesions. No penis lesions or urethral discharge.

## 2011-12-16 ENCOUNTER — Encounter: Payer: Self-pay | Admitting: Family Medicine

## 2011-12-16 DIAGNOSIS — N50819 Testicular pain, unspecified: Secondary | ICD-10-CM | POA: Insufficient documentation

## 2011-12-16 NOTE — Assessment & Plan Note (Signed)
Diet controlled, will check again in 2013, continue diet and exercise.

## 2011-12-16 NOTE — Assessment & Plan Note (Signed)
Cut back to 20mg  of pravastatin and recheck later in 2013. Continue diet and exercise.

## 2011-12-16 NOTE — Assessment & Plan Note (Signed)
No sx, will check labs later in 2013

## 2011-12-16 NOTE — Assessment & Plan Note (Signed)
Episodica, with normal exam today.  Can follow clinically.

## 2011-12-16 NOTE — Assessment & Plan Note (Signed)
Controlled, continue as is.  

## 2012-04-07 ENCOUNTER — Ambulatory Visit (INDEPENDENT_AMBULATORY_CARE_PROVIDER_SITE_OTHER): Payer: BC Managed Care – PPO

## 2012-04-07 VITALS — Ht 64.75 in

## 2012-04-07 DIAGNOSIS — Z111 Encounter for screening for respiratory tuberculosis: Secondary | ICD-10-CM

## 2012-04-10 ENCOUNTER — Other Ambulatory Visit: Payer: Self-pay | Admitting: *Deleted

## 2012-04-10 NOTE — Telephone Encounter (Signed)
Pt left v/m at 4:57 ; pt takes metoprolol taking 1 1/2 tabs daily.

## 2012-04-10 NOTE — Telephone Encounter (Signed)
Refill request states:  Take 1 and 1/2 by mouth daily.  Our meds list says:  Take 1 tablet daily.  Please advise.

## 2012-04-12 MED ORDER — METOPROLOL SUCCINATE ER 50 MG PO TB24: ORAL_TABLET | ORAL | Status: DC

## 2012-04-12 NOTE — Telephone Encounter (Signed)
Sent!

## 2012-06-10 ENCOUNTER — Other Ambulatory Visit (INDEPENDENT_AMBULATORY_CARE_PROVIDER_SITE_OTHER): Payer: BC Managed Care – PPO

## 2012-06-10 DIAGNOSIS — E119 Type 2 diabetes mellitus without complications: Secondary | ICD-10-CM

## 2012-06-10 LAB — COMPREHENSIVE METABOLIC PANEL
AST: 24 U/L (ref 0–37)
Albumin: 4 g/dL (ref 3.5–5.2)
Alkaline Phosphatase: 69 U/L (ref 39–117)
BUN: 10 mg/dL (ref 6–23)
Creatinine, Ser: 0.9 mg/dL (ref 0.4–1.5)
Potassium: 4.1 mEq/L (ref 3.5–5.1)

## 2012-06-10 LAB — LIPID PANEL
Cholesterol: 171 mg/dL (ref 0–200)
LDL Cholesterol: 110 mg/dL — ABNORMAL HIGH (ref 0–99)
VLDL: 21.2 mg/dL (ref 0.0–40.0)

## 2012-06-10 LAB — FERRITIN: Ferritin: 228.5 ng/mL (ref 22.0–322.0)

## 2012-06-15 ENCOUNTER — Encounter: Payer: Self-pay | Admitting: Family Medicine

## 2012-06-15 ENCOUNTER — Ambulatory Visit (INDEPENDENT_AMBULATORY_CARE_PROVIDER_SITE_OTHER): Payer: BC Managed Care – PPO | Admitting: Family Medicine

## 2012-06-15 VITALS — BP 144/94 | HR 72 | Temp 98.4°F | Ht 65.0 in | Wt 194.8 lb

## 2012-06-15 DIAGNOSIS — E7800 Pure hypercholesterolemia, unspecified: Secondary | ICD-10-CM

## 2012-06-15 DIAGNOSIS — Z0000 Encounter for general adult medical examination without abnormal findings: Secondary | ICD-10-CM

## 2012-06-15 DIAGNOSIS — E119 Type 2 diabetes mellitus without complications: Secondary | ICD-10-CM

## 2012-06-15 DIAGNOSIS — K219 Gastro-esophageal reflux disease without esophagitis: Secondary | ICD-10-CM

## 2012-06-15 DIAGNOSIS — I10 Essential (primary) hypertension: Secondary | ICD-10-CM

## 2012-06-15 MED ORDER — LANSOPRAZOLE 15 MG PO CPDR: 15.0000 mg | DELAYED_RELEASE_CAPSULE | Freq: Every day | ORAL | Status: DC

## 2012-06-15 NOTE — Assessment & Plan Note (Signed)
Routine anticipatory guidance given to patient.  See health maintenance. Wife and pt decided to foster a 55 month old, boy, Onalee Hua.  He's happy about this.   Flu shot prev done.   Tetanus 2006 No indication for early prostate or colon cancer screening.   Living will discussed, encouraged.

## 2012-06-15 NOTE — Progress Notes (Signed)
CPE- See plan.  Routine anticipatory guidance given to patient.  See health maintenance. Wife and pt decided to foster a 18 month old, boy, Onalee Hua.  He's happy about this.   Flu shot prev done.   Tetanus 2006 No indication for early prostate or colon cancer screening.   Living will discussed, encouraged.    Hypertension:    Using medication without problems or lightheadedness: yes Chest pain with exertion:no Edema:no Short of breath:no Average home BPs: occ checked, not recently.    Elevated Cholesterol: Using medications without problems:yes Muscle aches: occ, will dec to QOD dosing Diet compliance: yes Exercise: walking  Hemachromatosis. Diet controlled per patient, rare meat and avoiding high iron foods.  Diabetes:  No meds.  Hypoglycemic episodes:no sx Hyperglycemic episodes:no sx Feet problems:no Blood Sugars averaging: not checked A1c <7.   GERD and h/o esophageal ring.  Off PPI now with return of heartburn.   PMH and SH reviewed  Meds, vitals, and allergies reviewed.   ROS: See HPI.  Otherwise negative.    GEN: nad, alert and oriented HEENT: mucous membranes moist NECK: supple w/o LA CV: rrr. PULM: ctab, no inc wob ABD: soft, +bs EXT: no edema SKIN: no acute rash  Diabetic foot exam: Normal inspection No skin breakdown No calluses  Normal DP pulses Normal sensation to light touch and monofilament Nails normal

## 2012-06-15 NOTE — Assessment & Plan Note (Signed)
He'll change to qod dosing due to aches. Continue diet/exercise.

## 2012-06-15 NOTE — Assessment & Plan Note (Signed)
Labs stable, no jaundice or abd pain.  Doing well.

## 2012-06-15 NOTE — Assessment & Plan Note (Signed)
With h/o esophageal dilation, restart PPI and follow clinically.  No dysphagia now.

## 2012-06-15 NOTE — Patient Instructions (Addendum)
Check your BP a few times. If consistently >130/90, then let me know.  Avoid salt in the meantime.   Recheck labs in 6 months before a visit with Para March.  Start back on 15mg  of OTC prevacid a day.  If not improved, then notify the clinic for the 30mg  rx.  Take the cholesterol medicine every other day to see if that decreases the aches.  Take care, keep exercising.  Glad to see you.

## 2012-06-15 NOTE — Assessment & Plan Note (Signed)
Continue diet and exercise, recheck in 6 months.  Doing well.

## 2012-06-15 NOTE — Assessment & Plan Note (Signed)
He'll check BP out of clinic and notify us if elevated. Continue diet/exercise.

## 2012-12-07 ENCOUNTER — Other Ambulatory Visit (INDEPENDENT_AMBULATORY_CARE_PROVIDER_SITE_OTHER): Payer: BC Managed Care – PPO

## 2012-12-07 DIAGNOSIS — E119 Type 2 diabetes mellitus without complications: Secondary | ICD-10-CM

## 2012-12-07 LAB — HEPATIC FUNCTION PANEL
Albumin: 4.2 g/dL (ref 3.5–5.2)
Total Bilirubin: 0.8 mg/dL (ref 0.3–1.2)

## 2012-12-07 LAB — FERRITIN: Ferritin: 192.7 ng/mL (ref 22.0–322.0)

## 2012-12-07 LAB — HEMOGLOBIN A1C: Hgb A1c MFr Bld: 6.5 % (ref 4.6–6.5)

## 2012-12-14 ENCOUNTER — Ambulatory Visit: Payer: BC Managed Care – PPO | Admitting: Family Medicine

## 2012-12-17 ENCOUNTER — Ambulatory Visit (INDEPENDENT_AMBULATORY_CARE_PROVIDER_SITE_OTHER): Payer: BC Managed Care – PPO | Admitting: Family Medicine

## 2012-12-17 ENCOUNTER — Encounter: Payer: Self-pay | Admitting: Family Medicine

## 2012-12-17 DIAGNOSIS — E119 Type 2 diabetes mellitus without complications: Secondary | ICD-10-CM

## 2012-12-17 MED ORDER — PRAVASTATIN SODIUM 20 MG PO TABS: 20.0000 mg | ORAL_TABLET | ORAL | Status: DC

## 2012-12-17 NOTE — Progress Notes (Signed)
Hemachromatosis.  Avoiding high iron foods.  No abd pain.  Feeling well. Ferritin is decreasing, lowest it has been recently.  LFTs are normal.   Diabetes:  Using medications without difficulties:yes Hypoglycemic episodes:no sx Hyperglycemic episodes:no sx Feet problems:no Blood Sugars averaging: not checked D/w pt about low carb diet. He's having trouble cutting out bread.   He's working on his weight.    Meds, vitals, and allergies reviewed.   ROS: See HPI.  Otherwise negative.    GEN: nad, alert and oriented HEENT: mucous membranes moist NECK: supple w/o LA CV: rrr. PULM: ctab, no inc wob ABD: soft, +bs EXT: no edema SKIN: no acute rash

## 2012-12-17 NOTE — Patient Instructions (Signed)
Schedule a physical for 6 months.  We'll check your labs before that.   Take care. Keep working on [employer].

## 2012-12-18 NOTE — Assessment & Plan Note (Signed)
A1c acceptable, he'll work on diet and weight. Not on ACE as BB has been used concurrently for anxiety. Continue current meds.

## 2012-12-18 NOTE — Assessment & Plan Note (Signed)
Ferritin decreasing and we'll follow ~q59months. Doing well.

## 2013-03-04 ENCOUNTER — Other Ambulatory Visit: Payer: Self-pay | Admitting: *Deleted

## 2013-03-04 ENCOUNTER — Other Ambulatory Visit: Payer: Self-pay

## 2013-03-04 MED ORDER — METOPROLOL SUCCINATE ER 50 MG PO TB24: ORAL_TABLET | ORAL | Status: DC

## 2013-03-25 ENCOUNTER — Encounter: Payer: Self-pay | Admitting: Family Medicine

## 2013-03-25 ENCOUNTER — Ambulatory Visit (INDEPENDENT_AMBULATORY_CARE_PROVIDER_SITE_OTHER): Payer: BC Managed Care – PPO | Admitting: Family Medicine

## 2013-03-25 VITALS — BP 104/80 | HR 89 | Temp 98.6°F | Wt 193.8 lb

## 2013-03-25 DIAGNOSIS — R197 Diarrhea, unspecified: Secondary | ICD-10-CM

## 2013-03-25 NOTE — Patient Instructions (Addendum)
Drink plenty of fluids, enough to keep your urine light or clear.  Avoid dairy.  Take imodium a few times a day if needed.  Wash your hands.

## 2013-03-25 NOTE — Progress Notes (Signed)
Stomach cramps initially on Monday, then 1 episode of diarrhea.  Similar sx on Tuesday.  Yesterday with come cramping, temp 99.5, then every BM was diarrhea.  Fever today 101.3. No vomiting. Slightly nauseated, better if he eats.  Eating--> cramping.  No blood in stool.  Bottom is raw. Diarrhea is pure liquid, green/brown. No mucous in stool. Not recently on abx, no travel, no exotic foods. No other sick contacts but wife had isolated stomach cramps last week, resolved now.  He's been burping more, more indigestion sx. Took some ibuprofen for the for fever. Was prev lightheaded, not now.  No other new meds. No abd pain but he feels bloated.  Cramping is better after a BM.   68 week old baby at home- foster baby.  Meds, vitals, and allergies reviewed.   ROS: See HPI.  Otherwise, noncontributory.  GEN: nad, alert and oriented HEENT: mucous membranes moist NECK: supple w/o LA CV: rrr PULM: ctab, no inc wob ABD: soft, +bs, not ttp EXT: no edema SKIN: no acute rash

## 2013-03-26 DIAGNOSIS — R197 Diarrhea, unspecified: Secondary | ICD-10-CM | POA: Insufficient documentation

## 2013-03-26 NOTE — Assessment & Plan Note (Signed)
Sx worse with dairy ingestion. Likely viral process. Nontoxic, should resolve.  D/w pt about path/phys and routine course.  Fluids, avoid dairy, use imodium.  Hand washing, f/u prn. Benign exam w/o abd pain.

## 2013-06-06 ENCOUNTER — Other Ambulatory Visit: Payer: Self-pay | Admitting: Family Medicine

## 2013-06-06 DIAGNOSIS — E119 Type 2 diabetes mellitus without complications: Secondary | ICD-10-CM

## 2013-06-15 ENCOUNTER — Other Ambulatory Visit (INDEPENDENT_AMBULATORY_CARE_PROVIDER_SITE_OTHER): Payer: BC Managed Care – PPO

## 2013-06-15 DIAGNOSIS — E119 Type 2 diabetes mellitus without complications: Secondary | ICD-10-CM

## 2013-06-15 LAB — LIPID PANEL: Total CHOL/HDL Ratio: 4

## 2013-06-15 LAB — HEMOGLOBIN A1C: Hgb A1c MFr Bld: 7 % — ABNORMAL HIGH (ref 4.6–6.5)

## 2013-06-15 LAB — COMPREHENSIVE METABOLIC PANEL
ALT: 83 U/L — ABNORMAL HIGH (ref 0–53)
Alkaline Phosphatase: 65 U/L (ref 39–117)
CO2: 28 mEq/L (ref 19–32)
Calcium: 9.3 mg/dL (ref 8.4–10.5)
Chloride: 102 mEq/L (ref 96–112)
GFR: 103.72 mL/min (ref >60.00)
Potassium: 4.6 mEq/L (ref 3.5–5.1)
Sodium: 138 mEq/L (ref 135–145)
Total Protein: 7.3 g/dL (ref 6.0–8.3)

## 2013-06-15 LAB — FERRITIN: Ferritin: 298.6 ng/mL (ref 22.0–322.0)

## 2013-06-21 ENCOUNTER — Encounter: Payer: Self-pay | Admitting: Family Medicine

## 2013-06-21 ENCOUNTER — Ambulatory Visit (INDEPENDENT_AMBULATORY_CARE_PROVIDER_SITE_OTHER): Payer: BC Managed Care – PPO | Admitting: Family Medicine

## 2013-06-21 VITALS — BP 130/72 | HR 60 | Temp 97.9°F | Wt 198.8 lb

## 2013-06-21 DIAGNOSIS — I10 Essential (primary) hypertension: Secondary | ICD-10-CM

## 2013-06-21 DIAGNOSIS — E119 Type 2 diabetes mellitus without complications: Secondary | ICD-10-CM

## 2013-06-21 DIAGNOSIS — E7800 Pure hypercholesterolemia, unspecified: Secondary | ICD-10-CM

## 2013-06-21 DIAGNOSIS — K219 Gastro-esophageal reflux disease without esophagitis: Secondary | ICD-10-CM

## 2013-06-21 DIAGNOSIS — Z23 Encounter for immunization: Secondary | ICD-10-CM

## 2013-06-21 DIAGNOSIS — Z0000 Encounter for general adult medical examination without abnormal findings: Secondary | ICD-10-CM

## 2013-06-21 MED ORDER — METOPROLOL SUCCINATE ER 50 MG PO TB24: ORAL_TABLET | ORAL | Status: DC

## 2013-06-21 NOTE — Patient Instructions (Signed)
Check on getting an eye exam.  Cut back on meat and sugars.   Recheck labs in about 6 months.  Take care.  Glad to see you.

## 2013-06-21 NOTE — Assessment & Plan Note (Signed)
Routine anticipatory guidance given to patient.  See health maintenance. Flu shot due today.  Tetanus 2006 PNA 2014 No indication for early prostate or colon cancer screening.  Living will discussed, encouraged. Wife would be designated.   Diet and exercise discussed.   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  Has 2 foster kids at home now.

## 2013-06-21 NOTE — Assessment & Plan Note (Signed)
Controlled with no sx on current med.

## 2013-06-21 NOTE — Assessment & Plan Note (Signed)
Reasonable control, continue as is with current med. Labs d/w pt, along with diet/weight loss encouraged.

## 2013-06-21 NOTE — Assessment & Plan Note (Signed)
A1c, continue as is off meds. Labs d/w pt, along with diet/weight loss encouraged.  He understood.

## 2013-06-21 NOTE — Assessment & Plan Note (Signed)
Reasonable control, continue as is with current med. Labs d/w pt, along with diet/weight loss encouraged.  

## 2013-06-21 NOTE — Progress Notes (Signed)
CPE- See plan.  Routine anticipatory guidance given to patient.  See health maintenance. Flu shot due today.  Tetanus 2006 PNA 2014 No indication for early prostate or colon cancer screening.  Living will discussed, encouraged. Wife would be designated.   Diet and exercise discussed.   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  Has 2 foster kids at home now.    Diabetes:  No meds Hypoglycemic episodes: no sx  Hyperglycemic episodes: no sx Feet problems: no  Blood Sugars averaging: not checked eye exam within last year: encouraged, due.   Hemachromatosis.  Diet has been altered. More red meat in diet.  Ferritin is up in the meantime. Discussed.    Hypertension:    Using medication without problems: yes Chest pain with exertion:no Edema:no Short of breath:no BP not checked  Elevated Cholesterol: Using medications without problems:yes Muscle aches: no Diet compliance:see above Exercise: walking 1 hour a day, walking the dogs Labs d/w pt.   PMH and SH reviewed.   Vital signs, Meds and allergies reviewed.  ROS: See HPI.  Otherwise nontributory.   GEN: nad, alert and oriented HEENT: mucous membranes moist NECK: supple w/o LA CV: rrr.  no murmur PULM: ctab, no inc wob ABD: soft, +bs EXT: no edema SKIN: no acute rash  Diabetic foot exam: Normal inspection No skin breakdown No calluses  Normal DP pulses Normal sensation to light tough and monofilament Nails normal

## 2013-06-21 NOTE — Assessment & Plan Note (Signed)
Labs d/w pt.  Cut back on meat and we'll recheck labs in a few months.  He agrees.

## 2013-08-17 ENCOUNTER — Other Ambulatory Visit: Payer: Self-pay | Admitting: Family Medicine

## 2013-12-20 ENCOUNTER — Other Ambulatory Visit (INDEPENDENT_AMBULATORY_CARE_PROVIDER_SITE_OTHER): Payer: BC Managed Care – PPO

## 2013-12-20 ENCOUNTER — Encounter: Payer: Self-pay | Admitting: Family Medicine

## 2013-12-20 DIAGNOSIS — E7800 Pure hypercholesterolemia, unspecified: Secondary | ICD-10-CM

## 2013-12-20 DIAGNOSIS — E119 Type 2 diabetes mellitus without complications: Secondary | ICD-10-CM

## 2013-12-20 DIAGNOSIS — I10 Essential (primary) hypertension: Secondary | ICD-10-CM

## 2013-12-20 DIAGNOSIS — K7689 Other specified diseases of liver: Secondary | ICD-10-CM

## 2013-12-20 LAB — HEPATIC FUNCTION PANEL
ALK PHOS: 69 U/L (ref 39–117)
ALT: 138 U/L — ABNORMAL HIGH (ref 0–53)
AST: 51 U/L — ABNORMAL HIGH (ref 0–37)
Albumin: 4.2 g/dL (ref 3.5–5.2)
BILIRUBIN TOTAL: 0.9 mg/dL (ref 0.3–1.2)
Bilirubin, Direct: 0.1 mg/dL (ref 0.0–0.3)
Total Protein: 7.1 g/dL (ref 6.0–8.3)

## 2013-12-20 LAB — FERRITIN: FERRITIN: 356 ng/mL — AB (ref 22.0–322.0)

## 2013-12-20 LAB — HEMOGLOBIN A1C: Hgb A1c MFr Bld: 7.2 % — ABNORMAL HIGH (ref 4.6–6.5)

## 2013-12-21 ENCOUNTER — Telehealth: Payer: Self-pay | Admitting: Family Medicine

## 2013-12-21 NOTE — Telephone Encounter (Signed)
Relevant patient education assigned to patient using Emmi. ° °

## 2013-12-24 ENCOUNTER — Telehealth: Payer: Self-pay

## 2013-12-24 NOTE — Telephone Encounter (Signed)
Relevant patient education assigned to patient using Emmi. ° °

## 2013-12-27 ENCOUNTER — Ambulatory Visit (INDEPENDENT_AMBULATORY_CARE_PROVIDER_SITE_OTHER): Payer: BC Managed Care – PPO | Admitting: Family Medicine

## 2013-12-27 ENCOUNTER — Encounter: Payer: Self-pay | Admitting: Family Medicine

## 2013-12-27 DIAGNOSIS — E119 Type 2 diabetes mellitus without complications: Secondary | ICD-10-CM

## 2013-12-27 NOTE — Progress Notes (Signed)
Pre visit review using our clinic review tool, if applicable. No additional management support is needed unless otherwise documented below in the visit note.  Hemochromatosis.  More red meat recently, before his labs.  He discussed with his wife.  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  No jaundice.  No abd pain. No bleeding.  Weight is stable.    Diabetes:  No meds Hypoglycemic episodes:not checked Hyperglycemic episodes:not checked Feet problems:no Blood Sugars averaging:  Not checked.  eye exam within last year: d/w pt.   Due for MALB.  D/w pt.   Meds, vitals, and allergies reviewed.   ROS: See HPI.  Otherwise negative.    GEN: nad, alert and oriented HEENT: mucous membranes moist NECK: supple w/o LA CV: rrr. PULM: ctab, no inc wob ABD: soft, +bs EXT: no edema SKIN: no acute rash  Diabetic foot exam: Normal inspection No skin breakdown No calluses  Normal DP pulses Normal sensation to light touch and monofilament Nails normal

## 2013-12-27 NOTE — Patient Instructions (Addendum)
Lab on the way out- urine.  Call about an eye exam.   Recheck labs in about 3 months.  Cut out red meat in the meantime.  We'll go from there.  Take care.

## 2013-12-28 LAB — MICROALBUMIN / CREATININE URINE RATIO
Creatinine,U: 167.4 mg/dL
Microalb Creat Ratio: 0.5 mg/g (ref 0.0–30.0)
Microalb, Ur: 0.8 mg/dL (ref 0.0–1.9)

## 2013-12-28 NOTE — Assessment & Plan Note (Signed)
More red meat recently, before his labs.  He discussed with his wife.  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  No jaundice.  No abd pain. No bleeding.  Weight is stable.  Recheck in 3 months.  He'll cut out red meat in meantime. He agrees.

## 2013-12-28 NOTE — Assessment & Plan Note (Signed)
No meds, check MALB today.  He'll work on weight in the meantime.  He agrees.

## 2013-12-30 ENCOUNTER — Encounter: Payer: Self-pay | Admitting: *Deleted

## 2014-02-07 ENCOUNTER — Other Ambulatory Visit: Payer: Self-pay | Admitting: Family Medicine

## 2014-03-29 ENCOUNTER — Other Ambulatory Visit (INDEPENDENT_AMBULATORY_CARE_PROVIDER_SITE_OTHER): Payer: BC Managed Care – PPO

## 2014-03-29 DIAGNOSIS — E119 Type 2 diabetes mellitus without complications: Secondary | ICD-10-CM

## 2014-03-29 DIAGNOSIS — E1165 Type 2 diabetes mellitus with hyperglycemia: Secondary | ICD-10-CM

## 2014-03-29 DIAGNOSIS — I10 Essential (primary) hypertension: Secondary | ICD-10-CM

## 2014-03-29 LAB — HEPATIC FUNCTION PANEL
ALK PHOS: 69 U/L (ref 39–117)
ALT: 55 U/L — ABNORMAL HIGH (ref 0–53)
AST: 27 U/L (ref 0–37)
Albumin: 4 g/dL (ref 3.5–5.2)
BILIRUBIN TOTAL: 1 mg/dL (ref 0.2–1.2)
Bilirubin, Direct: 0.1 mg/dL (ref 0.0–0.3)
Total Protein: 6.7 g/dL (ref 6.0–8.3)

## 2014-03-29 LAB — FERRITIN: FERRITIN: 240.7 ng/mL (ref 22.0–322.0)

## 2014-03-29 LAB — HEMOGLOBIN A1C: Hgb A1c MFr Bld: 7.2 % — ABNORMAL HIGH (ref 4.6–6.5)

## 2014-03-30 ENCOUNTER — Encounter: Payer: Self-pay | Admitting: *Deleted

## 2014-06-03 LAB — HM DIABETES EYE EXAM

## 2014-06-15 ENCOUNTER — Other Ambulatory Visit: Payer: Self-pay | Admitting: Family Medicine

## 2014-06-15 DIAGNOSIS — E119 Type 2 diabetes mellitus without complications: Secondary | ICD-10-CM

## 2014-06-16 ENCOUNTER — Other Ambulatory Visit (INDEPENDENT_AMBULATORY_CARE_PROVIDER_SITE_OTHER): Payer: BC Managed Care – PPO

## 2014-06-16 DIAGNOSIS — E7800 Pure hypercholesterolemia, unspecified: Secondary | ICD-10-CM

## 2014-06-16 DIAGNOSIS — I10 Essential (primary) hypertension: Secondary | ICD-10-CM

## 2014-06-16 DIAGNOSIS — E119 Type 2 diabetes mellitus without complications: Secondary | ICD-10-CM

## 2014-06-16 DIAGNOSIS — Z0000 Encounter for general adult medical examination without abnormal findings: Secondary | ICD-10-CM

## 2014-06-16 LAB — COMPREHENSIVE METABOLIC PANEL
ALBUMIN: 3.8 g/dL (ref 3.5–5.2)
ALT: 66 U/L — ABNORMAL HIGH (ref 0–53)
AST: 36 U/L (ref 0–37)
Alkaline Phosphatase: 69 U/L (ref 39–117)
BUN: 18 mg/dL (ref 6–23)
CO2: 26 meq/L (ref 19–32)
Calcium: 9.1 mg/dL (ref 8.4–10.5)
Chloride: 103 mEq/L (ref 96–112)
Creatinine, Ser: 1.1 mg/dL (ref 0.4–1.5)
GFR: 76.68 mL/min (ref >60.00)
GLUCOSE: 116 mg/dL — AB (ref 70–99)
POTASSIUM: 4 meq/L (ref 3.5–5.1)
Sodium: 136 mEq/L (ref 135–145)
TOTAL PROTEIN: 7.4 g/dL (ref 6.0–8.3)
Total Bilirubin: 1.2 mg/dL (ref 0.2–1.2)

## 2014-06-16 LAB — FERRITIN: FERRITIN: 300.7 ng/mL (ref 22.0–322.0)

## 2014-06-16 LAB — LIPID PANEL
CHOLESTEROL: 174 mg/dL (ref 0–200)
HDL: 37.8 mg/dL — ABNORMAL LOW (ref >39.00)
LDL CALC: 105 mg/dL — AB (ref 0–99)
NonHDL: 136.2
TRIGLYCERIDES: 155 mg/dL — AB (ref 0.0–149.0)
Total CHOL/HDL Ratio: 5
VLDL: 31 mg/dL (ref 0.0–40.0)

## 2014-06-16 LAB — HEMOGLOBIN A1C: HEMOGLOBIN A1C: 7.5 % — AB (ref 4.6–6.5)

## 2014-06-23 ENCOUNTER — Ambulatory Visit (INDEPENDENT_AMBULATORY_CARE_PROVIDER_SITE_OTHER): Payer: BC Managed Care – PPO | Admitting: Family Medicine

## 2014-06-23 ENCOUNTER — Encounter: Payer: Self-pay | Admitting: Family Medicine

## 2014-06-23 VITALS — BP 158/102 | HR 62 | Temp 98.1°F | Ht 64.25 in | Wt 201.2 lb

## 2014-06-23 DIAGNOSIS — I10 Essential (primary) hypertension: Secondary | ICD-10-CM

## 2014-06-23 DIAGNOSIS — E119 Type 2 diabetes mellitus without complications: Secondary | ICD-10-CM

## 2014-06-23 DIAGNOSIS — B349 Viral infection, unspecified: Secondary | ICD-10-CM

## 2014-06-23 DIAGNOSIS — Z7189 Other specified counseling: Secondary | ICD-10-CM

## 2014-06-23 DIAGNOSIS — Z0000 Encounter for general adult medical examination without abnormal findings: Secondary | ICD-10-CM

## 2014-06-23 DIAGNOSIS — Z23 Encounter for immunization: Secondary | ICD-10-CM

## 2014-06-23 NOTE — Progress Notes (Signed)
Pre visit review using our clinic review tool, if applicable. No additional management support is needed unless otherwise documented below in the visit note.  CPE- See plan.  Routine anticipatory guidance given to patient.  See health maintenance. Tetanus 2006 Flu shot today.  PNA 2014 Shingles shot not due.  Colon and prostate cancer screening not due.   Diet and exercise d/w pt.  Complicated by working nights.  He's working hard on diet, to get his ferritin down.  Much less red meat.  Encouraged exercise, done mainly at work.   Living will d/w pt.   Would have his wife designated if patient were incapacitated.    Recently with ST, stuffy, cough with sputum.  Tickle in his throat.  Less stuffy now.  Fatigued (caring for his young child).  BP elevation noted today.  Had taken a decongestant recently.   HH, with ferritin inc noted.  See above re: diet.  He has some occ upper abd discomfort, but not limited to the RUQ.  No jaundice or other hepatic sx.    Hypertension:    Using medication without problems or lightheadedness: yes Chest pain with exertion:no Edema:no Short of breath:no Average home BPs: usually lower than today, had recently taken a decongestant.   Other issues:  Diabetes:  No meds Hypoglycemic episodes:not checked, no sx Hyperglycemic episodes: not checked, no sx Feet problems: no  Blood Sugars averaging: not checked eye exam within last year: ~3 weeks ago per patient report.    PMH and SH reviewed  Meds, vitals, and allergies reviewed.   ROS: See HPI.  Otherwise negative.    GEN: nad, alert and oriented HEENT: mucous membranes moist, tm w/o erythema, nasal exam w/o erythema, clear discharge noted,  OP with cobblestoning NECK: supple w/o LA CV: rrr.   PULM: ctab, no inc wob EXT: no edema SKIN: no acute rash  Diabetic foot exam: Normal inspection No skin breakdown No calluses  Normal DP pulses Normal sensation to light touch and monofilament Nails  normal

## 2014-06-23 NOTE — Patient Instructions (Signed)
Try plain claritin, not claritin D.  Notify me with your BP readings in a few days.  We'll be in touch in the meantime about your lab f/u.  Cut back on meat in the meantime.  Don't change your other meds for now.  Take care. Glad to see you.

## 2014-06-24 ENCOUNTER — Telehealth: Payer: Self-pay | Admitting: Family Medicine

## 2014-06-24 DIAGNOSIS — E119 Type 2 diabetes mellitus without complications: Secondary | ICD-10-CM

## 2014-06-24 DIAGNOSIS — B349 Viral infection, unspecified: Secondary | ICD-10-CM | POA: Insufficient documentation

## 2014-06-24 DIAGNOSIS — Z7189 Other specified counseling: Secondary | ICD-10-CM | POA: Insufficient documentation

## 2014-06-24 NOTE — Telephone Encounter (Signed)
Spoke with Terri our [facility], she will discuss with Dr Damita Dunnings.

## 2014-06-24 NOTE — Telephone Encounter (Signed)
Please call pt.   He had gotten his ferritin down prev with diet changes, and he was avoiding red meat, but his levels went back up.  It is likely best to get therapeutic phlebotomy set up, to get his ferritin <200, closer to 100.   I would get two rounds of the phlebotomy set up and then recheck his ferritin about 10 days after the second round.   We can determine the need for future phlebotomy after that.  I would recheck his A1c in about 3 months.  The orders for the f/u A1c (3 months) and ferritin (about 1 month) are in.  Please forward this to Rosaria Ferries to talk to me about setting up phlebotomy.  Thanks.

## 2014-06-24 NOTE — Assessment & Plan Note (Signed)
D/w pt re: labs and diet.  Will not add meds, but needs work on weight loss.  Would recheck in 3 months.  See following phone note.

## 2014-06-24 NOTE — Telephone Encounter (Signed)
Lm on pts vm requesting a call back 

## 2014-06-24 NOTE — Assessment & Plan Note (Signed)
Routine anticipatory guidance given to patient.  See health maintenance. Tetanus 2006 Flu shot today.  PNA 2014 Shingles shot not due.  Colon and prostate cancer screening not due.   Diet and exercise d/w pt.  Complicated by working nights.  He's working hard on diet, to get his ferritin down.  Much less red meat.  Encouraged exercise, done mainly at work.   Living will d/w pt.   Would have his wife designated if patient were incapacitated.

## 2014-06-24 NOTE — Assessment & Plan Note (Signed)
Had been controlled until addition of decongestant.  He can check BP at home, no change in meds o/w for now.

## 2014-06-24 NOTE — Assessment & Plan Note (Signed)
Nontoxic, should resolve with supportive care.  Avoid decongestants.

## 2014-06-24 NOTE — Assessment & Plan Note (Signed)
D/w pt.  LFTs still okay for now.  See following phone note.

## 2014-06-26 NOTE — Telephone Encounter (Signed)
Call pt. He can get therapeutic phlebotomy done at [facility] office on [address].   I would get two rounds of the phlebotomy set up and then recheck his ferritin about 10 days after the second round.  Orders are in.  The f/u labs can be done here.  Please call the lab at [facility] to make sure they can see the orders and notify me if they have questions. Thanks.

## 2014-06-27 ENCOUNTER — Other Ambulatory Visit: Payer: BC Managed Care – PPO

## 2014-06-28 ENCOUNTER — Encounter: Payer: Self-pay | Admitting: Family Medicine

## 2014-06-28 ENCOUNTER — Other Ambulatory Visit: Payer: BC Managed Care – PPO

## 2014-06-28 NOTE — Telephone Encounter (Signed)
Lm/wife for the pt to call , I need to talk to him about the phlebotomy to be done at the [facility] lab.

## 2014-06-29 ENCOUNTER — Telehealth: Payer: Self-pay | Admitting: Family Medicine

## 2014-06-29 NOTE — Telephone Encounter (Signed)
Letter done for patient.  I tried to print it.  If it didn't come off, please print and send to patient.  Thanks.

## 2014-06-29 NOTE — Telephone Encounter (Signed)
Spoke to patient by telephone and was advised that he will pick the letter up tomorrow. Letter left up front.

## 2014-06-29 NOTE — Telephone Encounter (Signed)
Talked to the pt explaining the Phlebotomy procedure and where to go. He understood. Patient will have 2 procedures 2 weeks a part, then come in for a Ferritin.

## 2014-06-30 ENCOUNTER — Other Ambulatory Visit (INDEPENDENT_AMBULATORY_CARE_PROVIDER_SITE_OTHER): Payer: BC Managed Care – PPO

## 2014-06-30 ENCOUNTER — Telehealth: Payer: Self-pay

## 2014-06-30 LAB — CBC WITH DIFFERENTIAL/PLATELET
Basophils Absolute: 0.1 10*3/uL (ref 0.0–0.1)
Basophils Relative: 0.6 % (ref 0.0–3.0)
Eosinophils Absolute: 0.3 10*3/uL (ref 0.0–0.7)
Eosinophils Relative: 3.9 % (ref 0.0–5.0)
HCT: 46.7 % (ref 39.0–52.0)
Hemoglobin: 16 g/dL (ref 13.0–17.0)
Lymphocytes Relative: 42.3 % (ref 12.0–46.0)
Lymphs Abs: 3.7 10*3/uL (ref 0.7–4.0)
MCHC: 34.3 g/dL (ref 30.0–36.0)
MCV: 88.2 fl (ref 78.0–100.0)
MONO ABS: 0.6 10*3/uL (ref 0.1–1.0)
Monocytes Relative: 7 % (ref 3.0–12.0)
NEUTROS PCT: 46.2 % (ref 43.0–77.0)
Neutro Abs: 4.1 10*3/uL (ref 1.4–7.7)
PLATELETS: 303 10*3/uL (ref 150.0–400.0)
RBC: 5.29 Mil/uL (ref 4.22–5.81)
RDW: 12.5 % (ref 11.5–15.5)
WBC: 8.8 10*3/uL (ref 4.0–10.5)

## 2014-06-30 NOTE — Telephone Encounter (Signed)
Micheal Cruz with [facility] called report hgb 16.0 and hct 46.7; [name] wanted to know if pt needed phlebotomy; Dr Damita Dunnings said yes; pt does need phlebotomy and then another phlebotomy in 2 weeks and then reck labs at [facility] the week after 2nd phlebotomy. Micheal Cruz voiced understanding.

## 2014-06-30 NOTE — Telephone Encounter (Signed)
Noted, thanks!

## 2014-07-13 ENCOUNTER — Other Ambulatory Visit (INDEPENDENT_AMBULATORY_CARE_PROVIDER_SITE_OTHER): Payer: BC Managed Care – PPO

## 2014-07-13 DIAGNOSIS — E119 Type 2 diabetes mellitus without complications: Secondary | ICD-10-CM

## 2014-07-13 LAB — CBC WITH DIFFERENTIAL/PLATELET
Basophils Absolute: 0 10*3/uL (ref 0.0–0.1)
Basophils Relative: 0.5 % (ref 0.0–3.0)
Eosinophils Absolute: 0.3 10*3/uL (ref 0.0–0.7)
Eosinophils Relative: 4.3 % (ref 0.0–5.0)
HCT: 41.1 % (ref 39.0–52.0)
Hemoglobin: 13.9 g/dL (ref 13.0–17.0)
LYMPHS PCT: 42.1 % (ref 12.0–46.0)
Lymphs Abs: 2.7 10*3/uL (ref 0.7–4.0)
MCHC: 33.8 g/dL (ref 30.0–36.0)
MCV: 88.7 fl (ref 78.0–100.0)
MONOS PCT: 8 % (ref 3.0–12.0)
Monocytes Absolute: 0.5 10*3/uL (ref 0.1–1.0)
NEUTROS PCT: 45.1 % (ref 43.0–77.0)
Neutro Abs: 2.9 10*3/uL (ref 1.4–7.7)
PLATELETS: 275 10*3/uL (ref 150.0–400.0)
RBC: 4.64 Mil/uL (ref 4.22–5.81)
RDW: 13.4 % (ref 11.5–15.5)
WBC: 6.5 10*3/uL (ref 4.0–10.5)

## 2014-07-13 LAB — HEMOGLOBIN A1C: HEMOGLOBIN A1C: 7.5 % — AB (ref 4.6–6.5)

## 2014-07-13 LAB — FERRITIN: FERRITIN: 197.4 ng/mL (ref 22.0–322.0)

## 2014-07-14 ENCOUNTER — Other Ambulatory Visit: Payer: Self-pay | Admitting: Family Medicine

## 2014-07-14 DIAGNOSIS — E119 Type 2 diabetes mellitus without complications: Secondary | ICD-10-CM

## 2014-07-27 ENCOUNTER — Other Ambulatory Visit: Payer: Self-pay | Admitting: Family Medicine

## 2014-07-27 ENCOUNTER — Other Ambulatory Visit (INDEPENDENT_AMBULATORY_CARE_PROVIDER_SITE_OTHER): Payer: BC Managed Care – PPO

## 2014-07-27 LAB — FERRITIN: FERRITIN: 184.9 ng/mL (ref 22.0–322.0)

## 2014-08-10 ENCOUNTER — Other Ambulatory Visit: Payer: Self-pay | Admitting: Family Medicine

## 2014-08-10 ENCOUNTER — Other Ambulatory Visit (INDEPENDENT_AMBULATORY_CARE_PROVIDER_SITE_OTHER): Payer: BC Managed Care – PPO

## 2014-08-10 LAB — CBC WITH DIFFERENTIAL/PLATELET
BASOS ABS: 0 10*3/uL (ref 0.0–0.1)
Basophils Relative: 0.6 % (ref 0.0–3.0)
Eosinophils Absolute: 0.4 10*3/uL (ref 0.0–0.7)
Eosinophils Relative: 5.4 % — ABNORMAL HIGH (ref 0.0–5.0)
HCT: 43.7 % (ref 39.0–52.0)
HEMOGLOBIN: 14.8 g/dL (ref 13.0–17.0)
LYMPHS PCT: 39.4 % (ref 12.0–46.0)
Lymphs Abs: 2.7 10*3/uL (ref 0.7–4.0)
MCHC: 33.9 g/dL (ref 30.0–36.0)
MCV: 89.6 fl (ref 78.0–100.0)
MONOS PCT: 8.1 % (ref 3.0–12.0)
Monocytes Absolute: 0.5 10*3/uL (ref 0.1–1.0)
NEUTROS ABS: 3.1 10*3/uL (ref 1.4–7.7)
Neutrophils Relative %: 46.5 % (ref 43.0–77.0)
Platelets: 285 10*3/uL (ref 150.0–400.0)
RBC: 4.88 Mil/uL (ref 4.22–5.81)
RDW: 12.9 % (ref 11.5–15.5)
WBC: 6.8 10*3/uL (ref 4.0–10.5)

## 2014-08-24 ENCOUNTER — Other Ambulatory Visit: Payer: Self-pay | Admitting: Family Medicine

## 2014-08-24 ENCOUNTER — Other Ambulatory Visit (INDEPENDENT_AMBULATORY_CARE_PROVIDER_SITE_OTHER): Payer: BC Managed Care – PPO

## 2014-08-24 LAB — CBC WITH DIFFERENTIAL/PLATELET
Basophils Absolute: 0 10*3/uL (ref 0.0–0.1)
Basophils Relative: 0.3 % (ref 0.0–3.0)
EOS ABS: 0.3 10*3/uL (ref 0.0–0.7)
EOS PCT: 4.9 % (ref 0.0–5.0)
HCT: 42.4 % (ref 39.0–52.0)
Hemoglobin: 14.1 g/dL (ref 13.0–17.0)
Lymphocytes Relative: 33 % (ref 12.0–46.0)
Lymphs Abs: 2.2 10*3/uL (ref 0.7–4.0)
MCHC: 33.2 g/dL (ref 30.0–36.0)
MCV: 90.1 fl (ref 78.0–100.0)
Monocytes Absolute: 0.4 10*3/uL (ref 0.1–1.0)
Monocytes Relative: 6.6 % (ref 3.0–12.0)
NEUTROS PCT: 55.2 % (ref 43.0–77.0)
Neutro Abs: 3.8 10*3/uL (ref 1.4–7.7)
PLATELETS: 250 10*3/uL (ref 150.0–400.0)
RBC: 4.7 Mil/uL (ref 4.22–5.81)
RDW: 12.9 % (ref 11.5–15.5)
WBC: 6.8 10*3/uL (ref 4.0–10.5)

## 2014-09-05 ENCOUNTER — Other Ambulatory Visit (INDEPENDENT_AMBULATORY_CARE_PROVIDER_SITE_OTHER): Payer: BLUE CROSS/BLUE SHIELD

## 2014-09-05 LAB — FERRITIN: Ferritin: 65.7 ng/mL (ref 22.0–322.0)

## 2014-10-10 ENCOUNTER — Other Ambulatory Visit: Payer: BLUE CROSS/BLUE SHIELD

## 2014-10-11 ENCOUNTER — Other Ambulatory Visit (INDEPENDENT_AMBULATORY_CARE_PROVIDER_SITE_OTHER): Payer: BLUE CROSS/BLUE SHIELD

## 2014-10-11 DIAGNOSIS — E119 Type 2 diabetes mellitus without complications: Secondary | ICD-10-CM

## 2014-10-11 LAB — HEMOGLOBIN A1C: HEMOGLOBIN A1C: 8.1 % — AB (ref 4.6–6.5)

## 2014-10-13 ENCOUNTER — Encounter: Payer: Self-pay | Admitting: Family Medicine

## 2014-10-13 ENCOUNTER — Ambulatory Visit (INDEPENDENT_AMBULATORY_CARE_PROVIDER_SITE_OTHER): Payer: BLUE CROSS/BLUE SHIELD | Admitting: Family Medicine

## 2014-10-13 VITALS — BP 138/84 | HR 63 | Temp 97.8°F | Wt 202.5 lb

## 2014-10-13 DIAGNOSIS — E119 Type 2 diabetes mellitus without complications: Secondary | ICD-10-CM

## 2014-10-13 MED ORDER — METFORMIN HCL 500 MG PO TABS: 500.0000 mg | ORAL_TABLET | Freq: Every day | ORAL | Status: DC

## 2014-10-13 MED ORDER — PRAVASTATIN SODIUM 20 MG PO TABS: ORAL_TABLET | ORAL | Status: DC

## 2014-10-13 NOTE — Patient Instructions (Signed)
Recheck labs before a visit in about 3 months.   Start metformin once a day.  If you have GI upset, then let me know.  Take care.  Glad to see you.

## 2014-10-13 NOTE — Assessment & Plan Note (Signed)
D/w pt about diet.   Recheck labs 3 months.  He agrees.

## 2014-10-13 NOTE — Assessment & Plan Note (Signed)
D/w pt about diet, weight.  Add on metformin.  Recheck 3 months.  He agrees.

## 2014-10-13 NOTE — Progress Notes (Signed)
Pre visit review using our clinic review tool, if applicable. No additional management support is needed unless otherwise documented below in the visit note.  Diabetes:  No meds.   Hypoglycemic episodes: no Hyperglycemic episodes: no Feet problems: no Blood Sugars averaging: not checked.   Diet worse over the holidays.  D/w pt.   eye exam within last year: yes A1c d/w pt.  Up to 8.1.  D/w pt on metformin.    Meds, vitals, and allergies reviewed.   ROS: See HPI.  Otherwise negative.    GEN: nad, alert and oriented HEENT: mucous membranes moist NECK: supple w/o LA CV: rrr. PULM: ctab, no inc wob ABD: soft, +bs EXT: no edema

## 2014-10-13 NOTE — Addendum Note (Signed)
Addended by: Tonia Ghent on: 10/13/2014 08:45 AM   Modules accepted: Orders

## 2015-01-09 ENCOUNTER — Other Ambulatory Visit (INDEPENDENT_AMBULATORY_CARE_PROVIDER_SITE_OTHER): Payer: BLUE CROSS/BLUE SHIELD

## 2015-01-09 DIAGNOSIS — E119 Type 2 diabetes mellitus without complications: Secondary | ICD-10-CM

## 2015-01-09 LAB — HEPATIC FUNCTION PANEL
ALT: 103 U/L — ABNORMAL HIGH (ref 0–53)
AST: 53 U/L — AB (ref 0–37)
Albumin: 4.2 g/dL (ref 3.5–5.2)
Alkaline Phosphatase: 78 U/L (ref 39–117)
BILIRUBIN TOTAL: 0.9 mg/dL (ref 0.2–1.2)
Bilirubin, Direct: 0.2 mg/dL (ref 0.0–0.3)
Total Protein: 7.2 g/dL (ref 6.0–8.3)

## 2015-01-09 LAB — MICROALBUMIN / CREATININE URINE RATIO
Creatinine,U: 35.6 mg/dL
Microalb Creat Ratio: 2 mg/g (ref 0.0–30.0)

## 2015-01-09 LAB — FERRITIN: FERRITIN: 66 ng/mL (ref 22.0–322.0)

## 2015-01-09 LAB — HEMOGLOBIN A1C: Hgb A1c MFr Bld: 8.2 % — ABNORMAL HIGH (ref 4.6–6.5)

## 2015-01-13 ENCOUNTER — Encounter: Payer: Self-pay | Admitting: Family Medicine

## 2015-01-13 ENCOUNTER — Ambulatory Visit (INDEPENDENT_AMBULATORY_CARE_PROVIDER_SITE_OTHER): Payer: BLUE CROSS/BLUE SHIELD | Admitting: Family Medicine

## 2015-01-13 VITALS — BP 146/92 | HR 61 | Temp 98.1°F | Wt 199.0 lb

## 2015-01-13 DIAGNOSIS — E119 Type 2 diabetes mellitus without complications: Secondary | ICD-10-CM

## 2015-01-13 MED ORDER — METFORMIN HCL 500 MG PO TABS: 500.0000 mg | ORAL_TABLET | Freq: Two times a day (BID) | ORAL | Status: DC

## 2015-01-13 NOTE — Patient Instructions (Addendum)
Try to increase your exercise and up the metformin.   Recheck labs in about 3 months before a visit.  Take care.  Glad to see you.

## 2015-01-13 NOTE — Progress Notes (Signed)
Pre visit review using our clinic review tool, if applicable. No additional management support is needed unless otherwise documented below in the visit note.  Diabetes:  Using medications without difficulties:yes Hypoglycemic episodes:no sx Hyperglycemic episodes:no sx Feet problems:no Blood Sugars averaging: hasn't been checking his sugar.   eye exam within last year:yes  Hemachromatosis.  Ferritin wnl.  LFTs similar to prev, h/o fatty liver known.  Working hard on low iron diet.  Has avoided recent phlebotomy.   Meds, vitals, and allergies reviewed.   ROS: See HPI.  Otherwise negative.    GEN: nad, alert and oriented HEENT: mucous membranes moist NECK: supple w/o LA CV: rrr. PULM: ctab, no inc wob ABD: soft, +bs EXT: no edema SKIN: no acute rash

## 2015-01-16 NOTE — Assessment & Plan Note (Signed)
He's going to try to inc his metformin, if tolerated then continue. Continue work on diet and exercise, for weight reduction.  Plan on recheck in a few months, with him to update me in the meantime if needed.  rx given to patient for DM2 meter with lacets and strips.  He agrees with plan.

## 2015-01-16 NOTE — Assessment & Plan Note (Signed)
Ferritin wnl.  LFTs similar to prev, h/o fatty liver known.  Working hard on low iron diet.  Has avoided recent phlebotomy.  Needs weight loss re: likely fatty liver changes.  He agrees.

## 2015-02-09 ENCOUNTER — Encounter: Payer: Self-pay | Admitting: Gastroenterology

## 2015-03-13 ENCOUNTER — Other Ambulatory Visit: Payer: Self-pay | Admitting: Family Medicine

## 2015-04-05 ENCOUNTER — Other Ambulatory Visit: Payer: Self-pay | Admitting: Family Medicine

## 2015-04-05 DIAGNOSIS — E119 Type 2 diabetes mellitus without complications: Secondary | ICD-10-CM

## 2015-04-12 ENCOUNTER — Other Ambulatory Visit (INDEPENDENT_AMBULATORY_CARE_PROVIDER_SITE_OTHER): Payer: BLUE CROSS/BLUE SHIELD

## 2015-04-12 DIAGNOSIS — E119 Type 2 diabetes mellitus without complications: Secondary | ICD-10-CM | POA: Diagnosis not present

## 2015-04-12 LAB — HEPATIC FUNCTION PANEL
ALT: 67 U/L — AB (ref 0–53)
AST: 33 U/L (ref 0–37)
Albumin: 4.1 g/dL (ref 3.5–5.2)
Alkaline Phosphatase: 57 U/L (ref 39–117)
BILIRUBIN DIRECT: 0.1 mg/dL (ref 0.0–0.3)
BILIRUBIN TOTAL: 0.5 mg/dL (ref 0.2–1.2)
TOTAL PROTEIN: 6.6 g/dL (ref 6.0–8.3)

## 2015-04-12 LAB — HEMOGLOBIN A1C: Hgb A1c MFr Bld: 6.7 % — ABNORMAL HIGH (ref 4.6–6.5)

## 2015-04-12 LAB — FERRITIN: FERRITIN: 41.8 ng/mL (ref 22.0–322.0)

## 2015-04-18 ENCOUNTER — Ambulatory Visit (INDEPENDENT_AMBULATORY_CARE_PROVIDER_SITE_OTHER): Payer: BLUE CROSS/BLUE SHIELD | Admitting: Family Medicine

## 2015-04-18 ENCOUNTER — Encounter: Payer: Self-pay | Admitting: Family Medicine

## 2015-04-18 VITALS — BP 116/80 | HR 61 | Temp 98.1°F | Wt 194.8 lb

## 2015-04-18 DIAGNOSIS — Z114 Encounter for screening for human immunodeficiency virus [HIV]: Secondary | ICD-10-CM | POA: Diagnosis not present

## 2015-04-18 DIAGNOSIS — E119 Type 2 diabetes mellitus without complications: Secondary | ICD-10-CM

## 2015-04-18 NOTE — Progress Notes (Signed)
Pre visit review using our clinic review tool, if applicable. No additional management support is needed unless otherwise documented below in the visit note.  Diabetes:  Using medications without difficulties: he has had trouble scheduling the second metformin dose.   Hypoglycemic episodes: no Hyperglycemic episodes: no Feet problems: no Blood Sugars averaging: usually 100-120.   eye exam within last year: yes A1c improved.   D/w pt. He's working hard on diet.  Weight loss- intentional- noted.    He opted in for HIV screening with next lab draw.   D/w pt.   He and his wife are in the process of adopting foster children.    LFTs and ferritin improved w/o therapeutic phlebotomy.  Labs d/w pt.    Meds, vitals, and allergies reviewed.   ROS: See HPI.  Otherwise negative.    GEN: nad, alert and oriented HEENT: mucous membranes moist NECK: supple w/o LA CV: rrr. PULM: ctab, no inc wob ABD: soft, +bs EXT: no edema SKIN: no acute rash

## 2015-04-18 NOTE — Patient Instructions (Addendum)
Take care.  Thanks for your effort.  Glad to see you.  Recheck in about 3-4 months with labs before a physical.  Don't change your meds for now.  You can try to vary the timing of the second metformin dose.

## 2015-04-19 NOTE — Assessment & Plan Note (Signed)
LFTs and ferritin improved w/o therapeutic phlebotomy.  Labs d/w pt.

## 2015-04-19 NOTE — Assessment & Plan Note (Signed)
A1c improved.   D/w pt. He's working hard on diet.  Weight loss- intentional- noted.   Recheck in about 3-4 months with labs before a physical.  He can trying varying the timing of the second dose of metformin if needed.

## 2015-06-21 ENCOUNTER — Other Ambulatory Visit (INDEPENDENT_AMBULATORY_CARE_PROVIDER_SITE_OTHER): Payer: BLUE CROSS/BLUE SHIELD

## 2015-06-21 DIAGNOSIS — E119 Type 2 diabetes mellitus without complications: Secondary | ICD-10-CM

## 2015-06-21 DIAGNOSIS — Z114 Encounter for screening for human immunodeficiency virus [HIV]: Secondary | ICD-10-CM

## 2015-06-21 LAB — CBC WITH DIFFERENTIAL/PLATELET
BASOS PCT: 0.8 % (ref 0.0–3.0)
Basophils Absolute: 0.1 10*3/uL (ref 0.0–0.1)
EOS PCT: 7.8 % — AB (ref 0.0–5.0)
Eosinophils Absolute: 0.6 10*3/uL (ref 0.0–0.7)
HCT: 46.6 % (ref 39.0–52.0)
HEMOGLOBIN: 15.8 g/dL (ref 13.0–17.0)
LYMPHS ABS: 2.9 10*3/uL (ref 0.7–4.0)
Lymphocytes Relative: 38.6 % (ref 12.0–46.0)
MCHC: 34 g/dL (ref 30.0–36.0)
MCV: 88.7 fl (ref 78.0–100.0)
MONO ABS: 0.6 10*3/uL (ref 0.1–1.0)
MONOS PCT: 7.8 % (ref 3.0–12.0)
NEUTROS PCT: 45 % (ref 43.0–77.0)
Neutro Abs: 3.4 10*3/uL (ref 1.4–7.7)
Platelets: 265 10*3/uL (ref 150.0–400.0)
RBC: 5.26 Mil/uL (ref 4.22–5.81)
RDW: 13 % (ref 11.5–15.5)
WBC: 7.6 10*3/uL (ref 4.0–10.5)

## 2015-06-21 LAB — COMPREHENSIVE METABOLIC PANEL
ALT: 62 U/L — ABNORMAL HIGH (ref 0–53)
AST: 35 U/L (ref 0–37)
Albumin: 4.1 g/dL (ref 3.5–5.2)
Alkaline Phosphatase: 58 U/L (ref 39–117)
BUN: 14 mg/dL (ref 6–23)
CALCIUM: 9.3 mg/dL (ref 8.4–10.5)
CHLORIDE: 103 meq/L (ref 96–112)
CO2: 27 meq/L (ref 19–32)
CREATININE: 0.94 mg/dL (ref 0.40–1.50)
GFR: 91.52 mL/min (ref >60.00)
GLUCOSE: 130 mg/dL — AB (ref 70–99)
Potassium: 4.1 mEq/L (ref 3.5–5.1)
SODIUM: 138 meq/L (ref 135–145)
Total Bilirubin: 0.9 mg/dL (ref 0.2–1.2)
Total Protein: 6.8 g/dL (ref 6.0–8.3)

## 2015-06-21 LAB — LIPID PANEL
CHOLESTEROL: 150 mg/dL (ref 0–200)
HDL: 37.9 mg/dL — ABNORMAL LOW (ref >39.00)
LDL CALC: 84 mg/dL (ref 0–99)
NonHDL: 112.11
TRIGLYCERIDES: 141 mg/dL (ref 0.0–149.0)
Total CHOL/HDL Ratio: 4
VLDL: 28.2 mg/dL (ref 0.0–40.0)

## 2015-06-21 LAB — MICROALBUMIN / CREATININE URINE RATIO
Creatinine,U: 37.1 mg/dL
MICROALB/CREAT RATIO: 1.9 mg/g (ref 0.0–30.0)
Microalb, Ur: <0.7 mg/dL (ref 0.0–1.9)

## 2015-06-21 LAB — FERRITIN: FERRITIN: 68.4 ng/mL (ref 22.0–322.0)

## 2015-06-21 LAB — HEMOGLOBIN A1C: Hgb A1c MFr Bld: 7.2 % — ABNORMAL HIGH (ref 4.6–6.5)

## 2015-06-22 LAB — HIV ANTIBODY (ROUTINE TESTING W REFLEX): HIV: NONREACTIVE

## 2015-06-26 ENCOUNTER — Ambulatory Visit (INDEPENDENT_AMBULATORY_CARE_PROVIDER_SITE_OTHER): Payer: BLUE CROSS/BLUE SHIELD | Admitting: Family Medicine

## 2015-06-26 ENCOUNTER — Encounter: Payer: Self-pay | Admitting: Family Medicine

## 2015-06-26 VITALS — BP 138/84 | HR 64 | Temp 98.6°F | Ht 64.5 in | Wt 199.5 lb

## 2015-06-26 DIAGNOSIS — E7800 Pure hypercholesterolemia, unspecified: Secondary | ICD-10-CM

## 2015-06-26 DIAGNOSIS — Z0000 Encounter for general adult medical examination without abnormal findings: Secondary | ICD-10-CM | POA: Diagnosis not present

## 2015-06-26 DIAGNOSIS — Z23 Encounter for immunization: Secondary | ICD-10-CM

## 2015-06-26 DIAGNOSIS — I10 Essential (primary) hypertension: Secondary | ICD-10-CM

## 2015-06-26 DIAGNOSIS — E119 Type 2 diabetes mellitus without complications: Secondary | ICD-10-CM

## 2015-06-26 DIAGNOSIS — K219 Gastro-esophageal reflux disease without esophagitis: Secondary | ICD-10-CM

## 2015-06-26 NOTE — Progress Notes (Signed)
Pre visit review using our clinic review tool, if applicable. No additional management support is needed unless otherwise documented below in the visit note.  CPE- See plan.  Routine anticipatory guidance given to patient.  See health maintenance. Tetanus 2016 Flu shot today.  PNA 2014 Shingles shot not due.  Colon and prostate cancer screening not due.  Diet and exercise d/w pt. Complicated by working nights. He's working hard on diet, to get his ferritin stable. Much less red meat recently. Encouraged exercise, done mainly at work.  Living will d/w pt. Would have his wife designated if patient were incapacitated.   Diabetes:  Using medications without difficulties:yes, except for occ skipped dose of metformin.   Hypoglycemic episodes:no Hyperglycemic episodes:no Feet problems:no Blood Sugars averaging:~110s in the AMs eye exam within last year: d/w pt.  Was told by eye clinic okay for 2 year f/u, ie next year.   Labs d/w pt.    Elevated Cholesterol: Using medications without problems:yes Muscle aches: no Diet compliance:yes Exercise:encouraged  Hypertension:    Using medication without problems or lightheadedness: yes Chest pain with exertion:no Edema:no Short of breath:no  GERD usually controlled with rare flare of sx. PPI helps. Known hiatal hernia.  No ADE on med.   PMH and SH reviewed  Meds, vitals, and allergies reviewed.   ROS: See HPI.  Otherwise negative.    GEN: nad, alert and oriented HEENT: mucous membranes moist NECK: supple w/o LA CV: rrr. PULM: ctab, no inc wob ABD: soft, +bs EXT: no edema SKIN: no acute rash  Diabetic foot exam: Normal inspection No skin breakdown No calluses  Normal DP pulses Normal sensation to light touch and monofilament Nails normal

## 2015-06-26 NOTE — Patient Instructions (Addendum)
Recheck labs in about 4-5 months before a visit.   Take care.  Glad to see you.  Keep working on your weight in the meantime.   Happy Halloween.

## 2015-06-29 NOTE — Assessment & Plan Note (Signed)
Routine anticipatory guidance given to patient.  See health maintenance. Tetanus 2016 Flu shot today.  PNA 2014 Shingles shot not due.  Colon and prostate cancer screening not due.  Diet and exercise d/w pt. Complicated by working nights. He's working hard on diet, to get his ferritin stable. Much less red meat recently. Encouraged exercise, done mainly at work.  Living will d/w pt. Would have his wife designated if patient were incapacitated.

## 2015-06-29 NOTE — Assessment & Plan Note (Signed)
Continue statin, he'll work on diet and exercise.  Labs d/w pt.

## 2015-06-29 NOTE — Assessment & Plan Note (Signed)
Continue BB, he'll work on diet and exercise.  Labs d/w pt.

## 2015-06-29 NOTE — Assessment & Plan Note (Signed)
Continue PPI.  He agrees. 

## 2015-06-29 NOTE — Assessment & Plan Note (Signed)
Continue work on diet, ferritin reasonable for now. D/w pt. He agrees.

## 2015-06-29 NOTE — Assessment & Plan Note (Signed)
Continue metformin, he'll work on diet and exercise.  Labs d/w pt.  No need to change meds at this point, with minimal elevation in a1c.  He agrees.  See AVS.

## 2015-07-14 ENCOUNTER — Encounter: Payer: BLUE CROSS/BLUE SHIELD | Admitting: Family Medicine

## 2015-11-13 ENCOUNTER — Other Ambulatory Visit (INDEPENDENT_AMBULATORY_CARE_PROVIDER_SITE_OTHER): Payer: BLUE CROSS/BLUE SHIELD

## 2015-11-13 ENCOUNTER — Other Ambulatory Visit: Payer: Self-pay | Admitting: *Deleted

## 2015-11-13 ENCOUNTER — Encounter: Payer: Self-pay | Admitting: Family Medicine

## 2015-11-13 DIAGNOSIS — E119 Type 2 diabetes mellitus without complications: Secondary | ICD-10-CM

## 2015-11-13 LAB — FERRITIN: Ferritin: 157.8 ng/mL (ref 22.0–322.0)

## 2015-11-13 LAB — HEMOGLOBIN A1C: HEMOGLOBIN A1C: 8.5 % — AB (ref 4.6–6.5)

## 2015-11-13 MED ORDER — PRAVASTATIN SODIUM 20 MG PO TABS: ORAL_TABLET | ORAL | Status: DC

## 2015-11-20 ENCOUNTER — Encounter: Payer: Self-pay | Admitting: Family Medicine

## 2015-11-20 ENCOUNTER — Ambulatory Visit (INDEPENDENT_AMBULATORY_CARE_PROVIDER_SITE_OTHER): Payer: BLUE CROSS/BLUE SHIELD | Admitting: Family Medicine

## 2015-11-20 VITALS — BP 148/92 | HR 54 | Temp 98.3°F | Wt 200.5 lb

## 2015-11-20 DIAGNOSIS — E119 Type 2 diabetes mellitus without complications: Secondary | ICD-10-CM | POA: Diagnosis not present

## 2015-11-20 NOTE — Progress Notes (Signed)
Pre visit review using our clinic review tool, if applicable. No additional management support is needed unless otherwise documented below in the visit note.  Diabetes:  Using medications without difficulties: yes usually, but he had missed some doses of metformin.  He does better on the days at work.   Hypoglycemic episodes: no Hyperglycemic episodes: no Feet problems: no Blood Sugars averaging: ~120 in the AMs.  Diet and exercise d/w pt.  The past month has been complicated by mult family members with illnesses.   eye exam within last year: yes  Ferritin is up.  We talked about either therapeutic phlebotomy [facility].    He has some "drawing up" of the L testicle.  Started about 1 week ago.  No trauma.  Better in the last 24 hours.  Has tried mult sets of underwear.  No R testicle pain, no dysuria.    Meds, vitals, and allergies reviewed.   ROS: See HPI.  Otherwise negative.    GEN: nad, alert and oriented HEENT: mucous membranes moist NECK: supple w/o LA CV: rrr. PULM: ctab, no inc wob ABD: soft, +bs Testes bilaterally descended without nodularity, tenderness or masses. No scrotal masses or lesions. No penis lesions or urethral discharge, no hernia noted.

## 2015-11-20 NOTE — Patient Instructions (Addendum)
If you aren't going to give blood, then we need to set up therapeutic phlebotomy.   We need to recheck your ferritin about 2-3 weeks after giving blood.   Recheck A1c in about 3 months.   Take metformin at once if that will be easier.   Take care.  Glad to see you.

## 2015-11-21 NOTE — Assessment & Plan Note (Signed)
Wife has been buying some carb containing foods at the grocery store, she isn't diabetic.  He'll talk with her about it.  Recheck A1c in about 3 months.  Take 2 metformin at once if that will be easier, to get a higher dose in.  He agrees.  The testicle sx are improved and likely incidental.  He agrees to update me prn.

## 2015-11-21 NOTE — Assessment & Plan Note (Signed)
We need to recheck his ferritin about 2-3 weeks after giving blood, d/w pt.  He agrees.

## 2016-01-28 ENCOUNTER — Encounter: Payer: Self-pay | Admitting: Family Medicine

## 2016-01-29 ENCOUNTER — Other Ambulatory Visit: Payer: Self-pay | Admitting: *Deleted

## 2016-01-29 MED ORDER — METOPROLOL SUCCINATE ER 50 MG PO TB24: ORAL_TABLET | ORAL | Status: DC

## 2016-02-14 ENCOUNTER — Encounter: Payer: Self-pay | Admitting: Family Medicine

## 2016-02-16 ENCOUNTER — Other Ambulatory Visit (INDEPENDENT_AMBULATORY_CARE_PROVIDER_SITE_OTHER): Payer: BLUE CROSS/BLUE SHIELD

## 2016-02-16 DIAGNOSIS — E119 Type 2 diabetes mellitus without complications: Secondary | ICD-10-CM | POA: Diagnosis not present

## 2016-02-16 LAB — HEMOGLOBIN A1C: HEMOGLOBIN A1C: 8.2 % — AB (ref 4.6–6.5)

## 2016-02-16 LAB — FERRITIN: FERRITIN: 46.1 ng/mL (ref 22.0–322.0)

## 2016-02-20 ENCOUNTER — Encounter: Payer: Self-pay | Admitting: Family Medicine

## 2016-02-20 ENCOUNTER — Ambulatory Visit (INDEPENDENT_AMBULATORY_CARE_PROVIDER_SITE_OTHER): Payer: BLUE CROSS/BLUE SHIELD | Admitting: Family Medicine

## 2016-02-20 DIAGNOSIS — E119 Type 2 diabetes mellitus without complications: Secondary | ICD-10-CM

## 2016-02-20 MED ORDER — PRAVASTATIN SODIUM 20 MG PO TABS: ORAL_TABLET | ORAL | Status: DC

## 2016-02-20 MED ORDER — METFORMIN HCL 500 MG PO TABS: 1000.0000 mg | ORAL_TABLET | Freq: Two times a day (BID) | ORAL | Status: DC

## 2016-02-20 NOTE — Assessment & Plan Note (Signed)
Continue working on diet as best he can with other issues noted with hemochromatosis.  Up metformin to 1000mg  bid if tolerated, with routine cautions.  Recheck in about 3 months.  He agrees.

## 2016-02-20 NOTE — Assessment & Plan Note (Signed)
Ferritin improved after giving blood.  D/w pt about options.  Episodic donation may be the best option for long term ferritin suppression.  He'll not give blood before next lab draw, work on diet as best he can, and recheck ferritin and other labs.  He may need to give blood every 6-8 months, we'll try to give him a timeline when I see his next labs.  He agrees.

## 2016-02-20 NOTE — Patient Instructions (Addendum)
Recheck in about 3 months before a visit.  Try to increase the metformin up to 2 tabs twice a day in the meantime.   Call about an eye exam this fall.  Take care.  Glad to see you.

## 2016-02-20 NOTE — Progress Notes (Signed)
Pre visit review using our clinic review tool, if applicable. No additional management support is needed unless otherwise documented below in the visit note.  BP has been controlled at home, lower than today.  He usually runs higher at the doc or the dental clinic.    Ferritin improved after giving blood.  D/w pt about options.  Episodic donation may be the best option for long term ferritin suppression.    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   A1c d/w pt.  He has been able to tolerate metformin in the meantime, taking 2 at once.  D/w pt about dosing options.    He is busy with work and kids.    Meds, vitals, and allergies reviewed.   ROS: Per HPI unless specifically indicated in ROS section   GEN: nad, alert and oriented HEENT: mucous membranes moist NECK: supple w/o LA CV: rrr. PULM: ctab, no inc wob ABD: soft, +bs EXT: no edema  Diabetic foot exam: Normal inspection No skin breakdown No calluses  Normal DP pulses Normal sensation to light touch and monofilament Nails normal

## 2016-07-12 ENCOUNTER — Other Ambulatory Visit (INDEPENDENT_AMBULATORY_CARE_PROVIDER_SITE_OTHER): Payer: BLUE CROSS/BLUE SHIELD

## 2016-07-12 DIAGNOSIS — E119 Type 2 diabetes mellitus without complications: Secondary | ICD-10-CM

## 2016-07-12 LAB — COMPREHENSIVE METABOLIC PANEL
ALK PHOS: 70 U/L (ref 39–117)
ALT: 108 U/L — AB (ref 0–53)
AST: 60 U/L — AB (ref 0–37)
Albumin: 4.5 g/dL (ref 3.5–5.2)
BILIRUBIN TOTAL: 0.7 mg/dL (ref 0.2–1.2)
BUN: 12 mg/dL (ref 6–23)
CO2: 26 meq/L (ref 19–32)
Calcium: 9.6 mg/dL (ref 8.4–10.5)
Chloride: 103 mEq/L (ref 96–112)
Creatinine, Ser: 0.89 mg/dL (ref 0.40–1.50)
GFR: 97.04 mL/min (ref >60.00)
GLUCOSE: 145 mg/dL — AB (ref 70–99)
Potassium: 4.4 mEq/L (ref 3.5–5.1)
SODIUM: 138 meq/L (ref 135–145)
TOTAL PROTEIN: 7.1 g/dL (ref 6.0–8.3)

## 2016-07-12 LAB — CBC WITH DIFFERENTIAL/PLATELET
BASOS ABS: 0 10*3/uL (ref 0.0–0.1)
Basophils Relative: 0.5 % (ref 0.0–3.0)
EOS PCT: 5.4 % — AB (ref 0.0–5.0)
Eosinophils Absolute: 0.4 10*3/uL (ref 0.0–0.7)
HCT: 47.5 % (ref 39.0–52.0)
Hemoglobin: 16.3 g/dL (ref 13.0–17.0)
LYMPHS ABS: 2.9 10*3/uL (ref 0.7–4.0)
Lymphocytes Relative: 35.2 % (ref 12.0–46.0)
MCHC: 34.2 g/dL (ref 30.0–36.0)
MCV: 87.7 fl (ref 78.0–100.0)
MONO ABS: 0.6 10*3/uL (ref 0.1–1.0)
MONOS PCT: 7 % (ref 3.0–12.0)
NEUTROS ABS: 4.3 10*3/uL (ref 1.4–7.7)
NEUTROS PCT: 51.9 % (ref 43.0–77.0)
PLATELETS: 288 10*3/uL (ref 150.0–400.0)
RBC: 5.41 Mil/uL (ref 4.22–5.81)
RDW: 13.4 % (ref 11.5–15.5)
WBC: 8.2 10*3/uL (ref 4.0–10.5)

## 2016-07-12 LAB — FERRITIN: FERRITIN: 43.9 ng/mL (ref 22.0–322.0)

## 2016-07-12 LAB — MICROALBUMIN / CREATININE URINE RATIO
CREATININE, U: 82.1 mg/dL
MICROALB/CREAT RATIO: 0.9 mg/g (ref 0.0–30.0)
Microalb, Ur: <0.7 mg/dL (ref 0.0–1.9)

## 2016-07-12 LAB — LIPID PANEL
CHOL/HDL RATIO: 4
Cholesterol: 155 mg/dL (ref 0–200)
HDL: 43.3 mg/dL (ref >39.00)
LDL Cholesterol: 91 mg/dL (ref 0–99)
NONHDL: 112.13
Triglycerides: 105 mg/dL (ref 0.0–149.0)
VLDL: 21 mg/dL (ref 0.0–40.0)

## 2016-07-12 LAB — HEMOGLOBIN A1C: HEMOGLOBIN A1C: 7.8 % — AB (ref 4.6–6.5)

## 2016-07-16 ENCOUNTER — Encounter: Payer: Self-pay | Admitting: Family Medicine

## 2016-07-16 ENCOUNTER — Ambulatory Visit (INDEPENDENT_AMBULATORY_CARE_PROVIDER_SITE_OTHER): Payer: BLUE CROSS/BLUE SHIELD | Admitting: Family Medicine

## 2016-07-16 VITALS — BP 156/100 | HR 61 | Temp 98.8°F | Wt 199.2 lb

## 2016-07-16 DIAGNOSIS — Z0000 Encounter for general adult medical examination without abnormal findings: Secondary | ICD-10-CM

## 2016-07-16 DIAGNOSIS — E7800 Pure hypercholesterolemia, unspecified: Secondary | ICD-10-CM

## 2016-07-16 DIAGNOSIS — Z23 Encounter for immunization: Secondary | ICD-10-CM

## 2016-07-16 DIAGNOSIS — I10 Essential (primary) hypertension: Secondary | ICD-10-CM

## 2016-07-16 DIAGNOSIS — E119 Type 2 diabetes mellitus without complications: Secondary | ICD-10-CM

## 2016-07-16 DIAGNOSIS — F419 Anxiety disorder, unspecified: Secondary | ICD-10-CM

## 2016-07-16 DIAGNOSIS — Z7189 Other specified counseling: Secondary | ICD-10-CM

## 2016-07-16 NOTE — Progress Notes (Signed)
CPE- See plan.  Routine anticipatory guidance given to patient.  See health maintenance. Tetanus 2016 Flu shot today.  PNA 2014 Shingles shot not due.  Colon and prostate cancer screening not due.  Diet and exercise d/w pt. Complicated by working nights. He's working hard on diet, to get his ferritin stable.  Encouraged exercise, done mainly at work.  Living will d/w pt. Would have his wife designated if patient were incapacitated.   Elevated Cholesterol: Using medications without problems:yes Muscle aches: not at current dose.   Diet compliance: see below Exercise:see below.   Anxiety d/w pt.  More stressor at home- with the home itself and not his family.  He had water damage and extra costs, trying to refinance his home.  Still trying to cut a large tree that fell in the yard.  His mother had breast cancer, treated now.  He is working though all of this.  His diet suffered in the meantime, with comfort food, d/w pt.  He is able to manage the anxiety at this point.   Diabetes:  Using medications without difficulties:yes Hypoglycemic episodes:no Hyperglycemic episodes:no Feet problems:no Blood Sugars averaging: ~120 on home checks.   eye exam within last year:due, d/w pt.   Hemochromatosis.  Labs d/w pt.  D/w pt about avoiding red meat.  No abd pain.    PMH and SH reviewed  Meds, vitals, and allergies reviewed.   ROS: Per HPI.  Unless specifically indicated otherwise in HPI, the patient denies:  General: fever. Eyes: acute vision changes ENT: sore throat Cardiovascular: chest pain Respiratory: SOB GI: vomiting GU: dysuria Musculoskeletal: acute back pain Derm: acute rash Neuro: acute motor dysfunction Psych: worsening mood Endocrine: polydipsia Heme: bleeding Allergy: hayfever  GEN: nad, alert and oriented HEENT: mucous membranes moist NECK: supple w/o LA CV: rrr. PULM: ctab, no inc wob ABD: soft, +bs EXT: no edema SKIN: no acute rash  Diabetic  foot exam: Normal inspection No skin breakdown No calluses  Normal DP pulses Normal sensation to light touch and monofilament Nails normal

## 2016-07-16 NOTE — Patient Instructions (Signed)
Don't change your meds for now and recheck labs in about 3-4 months.  See what you can do about your diet in the meantime.  Take care.  Glad to see you.  Happy thanksgiving.   Check your BP a few times when at rest.   If persistently >140/>90 then update me.

## 2016-07-16 NOTE — Progress Notes (Signed)
Pre visit review using our clinic review tool, if applicable. No additional management support is needed unless otherwise documented below in the visit note. 

## 2016-07-17 NOTE — Assessment & Plan Note (Signed)
  Living will d/w pt. Would have his wife designated if patient were incapacitated.

## 2016-07-17 NOTE — Assessment & Plan Note (Signed)
Labs discussed with patient. He will check his blood pressure out of clinic a few times in update me if persistently elevated. See after visit summary.

## 2016-07-17 NOTE — Assessment & Plan Note (Signed)
Tetanus 2016 Flu shot today.  PNA 2014 Shingles shot not due.  Colon and prostate cancer screening not due.  Diet and exercise d/w pt. Complicated by working nights. He's working hard on diet, to get his ferritin stable.  Encouraged exercise, done mainly at work.  Living will d/w pt. Would have his wife designated if patient were incapacitated.

## 2016-07-17 NOTE — Assessment & Plan Note (Signed)
Not controlled, but improved from previous based on A1c. Significant social upheaval has affected his diet and exercise routine. He will work on both in the meantime. No change in medications at this point. He agrees. He will call about eye exam.

## 2016-07-17 NOTE — Assessment & Plan Note (Signed)
Ferritin not elevated. Continue to try to avoid red meat. Recheck periodically. He agrees.

## 2016-07-17 NOTE — Assessment & Plan Note (Signed)
Discussed with patient. Hopefully he has been through the toughest changes so far. He is aware that stressors affect his diet, with comfort eating. He is working to make healthy choices. Continue as is otherwise.

## 2016-07-17 NOTE — Assessment & Plan Note (Signed)
Continue statin. Labs discussed with patient. Discussed about diet and exercise.

## 2016-10-07 ENCOUNTER — Ambulatory Visit (INDEPENDENT_AMBULATORY_CARE_PROVIDER_SITE_OTHER): Payer: BLUE CROSS/BLUE SHIELD | Admitting: Family Medicine

## 2016-10-07 ENCOUNTER — Encounter: Payer: Self-pay | Admitting: Family Medicine

## 2016-10-07 DIAGNOSIS — J0100 Acute maxillary sinusitis, unspecified: Secondary | ICD-10-CM | POA: Diagnosis not present

## 2016-10-07 MED ORDER — DOXYCYCLINE HYCLATE 100 MG PO TABS
100.0000 mg | ORAL_TABLET | Freq: Two times a day (BID) | ORAL | 0 refills | Status: DC
Start: 2016-10-07 — End: 2016-11-08

## 2016-10-07 NOTE — Patient Instructions (Signed)
Start doxycyline and try to get some rest.  Drink enough to keep your urine clear or light colored.  Take care.  Glad to see you.  Update me as needed.

## 2016-10-07 NOTE — Progress Notes (Signed)
Pre visit review using our clinic review tool, if applicable. No additional management support is needed unless otherwise documented below in the visit note. 

## 2016-10-07 NOTE — Progress Notes (Signed)
duration of symptoms: last week- "the whole house had the flu."  He got sick about 1 week ago.   His family members are getting better.  He was getting better until he got sick again in the meantime, in the last few days.   Rhinorrhea:yes, discolored.   congestion:yes ear pain:no sore throat:no Cough:yes HA noted.   other concerns: upper facial pain.  Tooth pain.    Maxillary > facial pain.    Per HPI unless specifically indicated in ROS section   Meds, vitals, and allergies reviewed.   GEN: nad, alert and oriented HEENT: mucous membranes moist, TM w/o erythema, nasal epithelium injected, OP with cobblestoning, max sinuses ttp B NECK: supple w/o LA CV: rrr. PULM: ctab, no inc wob ABD: soft, +bs EXT: no edema

## 2016-10-08 DIAGNOSIS — J0100 Acute maxillary sinusitis, unspecified: Secondary | ICD-10-CM | POA: Insufficient documentation

## 2016-10-08 NOTE — Assessment & Plan Note (Signed)
Nontoxic. Rest and fluids. Start doxycycline. See after visit summary. Update me as needed. He agrees.

## 2016-10-20 ENCOUNTER — Other Ambulatory Visit: Payer: Self-pay | Admitting: Family Medicine

## 2016-10-20 DIAGNOSIS — E119 Type 2 diabetes mellitus without complications: Secondary | ICD-10-CM

## 2016-10-24 ENCOUNTER — Other Ambulatory Visit (INDEPENDENT_AMBULATORY_CARE_PROVIDER_SITE_OTHER): Payer: BLUE CROSS/BLUE SHIELD

## 2016-10-24 DIAGNOSIS — E119 Type 2 diabetes mellitus without complications: Secondary | ICD-10-CM

## 2016-10-24 LAB — CBC WITH DIFFERENTIAL/PLATELET
BASOS PCT: 0.6 % (ref 0.0–3.0)
Basophils Absolute: 0 10*3/uL (ref 0.0–0.1)
EOS PCT: 4.2 % (ref 0.0–5.0)
Eosinophils Absolute: 0.3 10*3/uL (ref 0.0–0.7)
HEMATOCRIT: 42.3 % (ref 39.0–52.0)
HEMOGLOBIN: 14.6 g/dL (ref 13.0–17.0)
LYMPHS PCT: 40.9 % (ref 12.0–46.0)
Lymphs Abs: 2.9 10*3/uL (ref 0.7–4.0)
MCHC: 34.4 g/dL (ref 30.0–36.0)
MCV: 88.5 fl (ref 78.0–100.0)
Monocytes Absolute: 0.5 10*3/uL (ref 0.1–1.0)
Monocytes Relative: 7 % (ref 3.0–12.0)
NEUTROS ABS: 3.4 10*3/uL (ref 1.4–7.7)
Neutrophils Relative %: 47.3 % (ref 43.0–77.0)
Platelets: 308 10*3/uL (ref 150.0–400.0)
RBC: 4.78 Mil/uL (ref 4.22–5.81)
RDW: 13.1 % (ref 11.5–15.5)
WBC: 7.1 10*3/uL (ref 4.0–10.5)

## 2016-10-24 LAB — FERRITIN: Ferritin: 79 ng/mL (ref 22.0–322.0)

## 2016-10-24 LAB — COMPREHENSIVE METABOLIC PANEL
ALBUMIN: 4.5 g/dL (ref 3.5–5.2)
ALT: 91 U/L — ABNORMAL HIGH (ref 0–53)
AST: 44 U/L — ABNORMAL HIGH (ref 0–37)
Alkaline Phosphatase: 63 U/L (ref 39–117)
BUN: 11 mg/dL (ref 6–23)
CHLORIDE: 103 meq/L (ref 96–112)
CO2: 29 meq/L (ref 19–32)
Calcium: 9.8 mg/dL (ref 8.4–10.5)
Creatinine, Ser: 0.85 mg/dL (ref 0.40–1.50)
GFR: 102.2 mL/min (ref >60.00)
Glucose, Bld: 120 mg/dL — ABNORMAL HIGH (ref 70–99)
POTASSIUM: 4.1 meq/L (ref 3.5–5.1)
SODIUM: 137 meq/L (ref 135–145)
Total Bilirubin: 0.5 mg/dL (ref 0.2–1.2)
Total Protein: 7.3 g/dL (ref 6.0–8.3)

## 2016-10-24 LAB — HEMOGLOBIN A1C: Hgb A1c MFr Bld: 8.3 % — ABNORMAL HIGH (ref 4.6–6.5)

## 2016-10-29 ENCOUNTER — Telehealth: Payer: Self-pay | Admitting: Family Medicine

## 2016-10-29 NOTE — Telephone Encounter (Signed)
Pt returned a call from Dr Josefine Class assistant.

## 2016-10-31 NOTE — Telephone Encounter (Signed)
Returned call to pt and notified him of his lab results. He verbalized understanding.

## 2016-10-31 NOTE — Telephone Encounter (Signed)
Patient returned Tasha's call.  Patient can be reached at [phone].

## 2016-10-31 NOTE — Telephone Encounter (Signed)
lmom for pt to return my call. I need to give him lab results.

## 2016-11-06 DIAGNOSIS — H524 Presbyopia: Secondary | ICD-10-CM | POA: Diagnosis not present

## 2016-11-06 LAB — HM DIABETES EYE EXAM

## 2016-11-08 ENCOUNTER — Encounter: Payer: Self-pay | Admitting: Family Medicine

## 2016-11-08 ENCOUNTER — Ambulatory Visit (INDEPENDENT_AMBULATORY_CARE_PROVIDER_SITE_OTHER): Payer: BLUE CROSS/BLUE SHIELD | Admitting: Family Medicine

## 2016-11-08 DIAGNOSIS — E119 Type 2 diabetes mellitus without complications: Secondary | ICD-10-CM | POA: Diagnosis not present

## 2016-11-08 MED ORDER — METOPROLOL SUCCINATE ER 50 MG PO TB24: ORAL_TABLET | ORAL | 3 refills | Status: DC

## 2016-11-08 NOTE — Patient Instructions (Addendum)
A1c and labs in about 3 months.  Work on diet in the meantime.  Take care.  Glad to see you. Update me as needed.  Check your sugar occasionally before and 2 hours after a meal.

## 2016-11-08 NOTE — Progress Notes (Signed)
He is on the way home after working his late shift.   Diabetes:  Using medications without difficulties:yes Hypoglycemic episodes:no Hyperglycemic episodes:no Feet problems:no Blood Sugars averaging:120-140s usually.   eye exam within last year: yes, this past week.  No retinopathy.   A1c up some from prev.  D/w pt.   His diet was clearly off in the last few months.  Hemochromatosis.  Ferritin up slightly, d/w pt.  Hgb not up, LFTs some better, so likely wouldn't need phlebotomy.  D/w pt about diet.    PMH and SH reviewed  Meds, vitals, and allergies reviewed.   ROS: Per HPI unless specifically indicated in ROS section   GEN: nad, alert and oriented HEENT: mucous membranes moist NECK: supple w/o LA CV: rrr. PULM: ctab, no inc wob ABD: soft, +bs but B lower ribs tender (after sleeping on a different mattress, getting better in the meantime per patient report) EXT: no edema SKIN: no acute rash

## 2016-11-08 NOTE — Progress Notes (Signed)
Pre visit review using our clinic review tool, if applicable. No additional management support is needed unless otherwise documented below in the visit note. 

## 2016-11-10 NOTE — Assessment & Plan Note (Signed)
Diet was clearly off recently. Discussed with patient. He'll work harder on diet. Discussed with him about checking sugar before and 2 hours after meals for comparison. He agrees. Update me if sugars not improving in the meantime.

## 2016-11-10 NOTE — Assessment & Plan Note (Signed)
Continue work on diet and we will recheck his LFTs and ferritin as we go along.  LFTs some better than previous. Ferritin acceptable for now. Does not look like he needs phlebotomy right now. Labs discussed with patient.

## 2017-02-03 ENCOUNTER — Other Ambulatory Visit: Payer: Self-pay | Admitting: Family Medicine

## 2017-02-03 DIAGNOSIS — E119 Type 2 diabetes mellitus without complications: Secondary | ICD-10-CM

## 2017-02-05 ENCOUNTER — Other Ambulatory Visit (INDEPENDENT_AMBULATORY_CARE_PROVIDER_SITE_OTHER): Payer: BLUE CROSS/BLUE SHIELD

## 2017-02-05 DIAGNOSIS — E119 Type 2 diabetes mellitus without complications: Secondary | ICD-10-CM | POA: Diagnosis not present

## 2017-02-05 LAB — COMPREHENSIVE METABOLIC PANEL
ALBUMIN: 4.4 g/dL (ref 3.5–5.2)
ALT: 76 U/L — ABNORMAL HIGH (ref 0–53)
AST: 50 U/L — AB (ref 0–37)
Alkaline Phosphatase: 65 U/L (ref 39–117)
BUN: 16 mg/dL (ref 6–23)
CHLORIDE: 102 meq/L (ref 96–112)
CO2: 29 meq/L (ref 19–32)
Calcium: 9.8 mg/dL (ref 8.4–10.5)
Creatinine, Ser: 0.97 mg/dL (ref 0.40–1.50)
GFR: 87.65 mL/min (ref >60.00)
GLUCOSE: 147 mg/dL — AB (ref 70–99)
POTASSIUM: 4.2 meq/L (ref 3.5–5.1)
SODIUM: 137 meq/L (ref 135–145)
Total Bilirubin: 0.7 mg/dL (ref 0.2–1.2)
Total Protein: 7.2 g/dL (ref 6.0–8.3)

## 2017-02-05 LAB — CBC WITH DIFFERENTIAL/PLATELET
BASOS PCT: 0.6 % (ref 0.0–3.0)
Basophils Absolute: 0 10*3/uL (ref 0.0–0.1)
EOS PCT: 2.6 % (ref 0.0–5.0)
Eosinophils Absolute: 0.2 10*3/uL (ref 0.0–0.7)
HCT: 42.8 % (ref 39.0–52.0)
HEMOGLOBIN: 14.8 g/dL (ref 13.0–17.0)
Lymphocytes Relative: 31.8 % (ref 12.0–46.0)
Lymphs Abs: 2.6 10*3/uL (ref 0.7–4.0)
MCHC: 34.7 g/dL (ref 30.0–36.0)
MCV: 87.5 fl (ref 78.0–100.0)
MONO ABS: 0.6 10*3/uL (ref 0.1–1.0)
Monocytes Relative: 7 % (ref 3.0–12.0)
NEUTROS ABS: 4.8 10*3/uL (ref 1.4–7.7)
Neutrophils Relative %: 58 % (ref 43.0–77.0)
PLATELETS: 273 10*3/uL (ref 150.0–400.0)
RBC: 4.89 Mil/uL (ref 4.22–5.81)
RDW: 12.4 % (ref 11.5–15.5)
WBC: 8.2 10*3/uL (ref 4.0–10.5)

## 2017-02-05 LAB — FERRITIN: Ferritin: 48 ng/mL (ref 22.0–322.0)

## 2017-02-05 LAB — HEMOGLOBIN A1C: HEMOGLOBIN A1C: 7.6 % — AB (ref 4.6–6.5)

## 2017-02-10 ENCOUNTER — Ambulatory Visit (INDEPENDENT_AMBULATORY_CARE_PROVIDER_SITE_OTHER): Payer: BLUE CROSS/BLUE SHIELD | Admitting: Family Medicine

## 2017-02-10 ENCOUNTER — Encounter: Payer: Self-pay | Admitting: Family Medicine

## 2017-02-10 DIAGNOSIS — E119 Type 2 diabetes mellitus without complications: Secondary | ICD-10-CM

## 2017-02-10 MED ORDER — PRAVASTATIN SODIUM 20 MG PO TABS: ORAL_TABLET | ORAL | 3 refills | Status: DC

## 2017-02-10 NOTE — Patient Instructions (Signed)
Keep exercising and recheck in about 4 months with labs ahead of time.  Take care.  Glad to see you.  Update me as needed.

## 2017-02-10 NOTE — Assessment & Plan Note (Addendum)
Ferritin improved, ALT better, without recent therapeutic phlebotomy.  Continue as is.  He agrees.  Labs d/w pt.

## 2017-02-10 NOTE — Assessment & Plan Note (Signed)
Improved, no change in meds.  Continue work on diet and exercise.  Exercise was the main change for patient and his lab are improved.  He agrees with plan.  See AVS.

## 2017-02-10 NOTE — Progress Notes (Signed)
Diabetes:  Using medications without difficulties: yes Hypoglycemic episodes:no Hyperglycemic episodes:no Feet problems:not from diabetes but some pain in the distal R midfoot- with irritation from his work shoes.  D/w pt about getting new inserts for his work shoes, since it usually only happens at work.   Blood Sugars averaging: usually ~120 when checked.   eye exam within last year: yes Labs d/w pt.  A1c improved.   He wants to limit his meds, d/w pt.    Hemochromatosis.  Labs d/w pt.  Ferritin improved.  He is working on diet.  AST similar to prev but ALT improved.  No abd pain, no jaundice.  He has been walking more in the meantime.    He is tired due to his work schedule, at baseline, but he is putting up with that.  D/w pt.    Meds, vitals, and allergies reviewed.   ROS: Per HPI unless specifically indicated in ROS section   GEN: nad, alert and oriented HEENT: mucous membranes moist NECK: supple w/o LA CV: rrr. PULM: ctab, no inc wob ABD: soft, +bs EXT: no edema SKIN: no jaundice.

## 2017-05-30 ENCOUNTER — Other Ambulatory Visit: Payer: Self-pay | Admitting: *Deleted

## 2017-05-30 ENCOUNTER — Encounter: Payer: Self-pay | Admitting: Family Medicine

## 2017-05-30 MED ORDER — METFORMIN HCL 500 MG PO TABS: 1000.0000 mg | ORAL_TABLET | Freq: Two times a day (BID) | ORAL | 3 refills | Status: DC

## 2017-06-08 ENCOUNTER — Other Ambulatory Visit: Payer: Self-pay | Admitting: Family Medicine

## 2017-06-08 DIAGNOSIS — E119 Type 2 diabetes mellitus without complications: Secondary | ICD-10-CM

## 2017-06-10 ENCOUNTER — Other Ambulatory Visit (INDEPENDENT_AMBULATORY_CARE_PROVIDER_SITE_OTHER): Payer: BLUE CROSS/BLUE SHIELD

## 2017-06-10 DIAGNOSIS — E119 Type 2 diabetes mellitus without complications: Secondary | ICD-10-CM

## 2017-06-10 LAB — CBC WITH DIFFERENTIAL/PLATELET
BASOS ABS: 0.1 10*3/uL (ref 0.0–0.1)
Basophils Relative: 0.8 % (ref 0.0–3.0)
EOS PCT: 5.9 % — AB (ref 0.0–5.0)
Eosinophils Absolute: 0.5 10*3/uL (ref 0.0–0.7)
HEMATOCRIT: 44.2 % (ref 39.0–52.0)
Hemoglobin: 14.7 g/dL (ref 13.0–17.0)
LYMPHS PCT: 41.4 % (ref 12.0–46.0)
Lymphs Abs: 3.2 10*3/uL (ref 0.7–4.0)
MCHC: 33.3 g/dL (ref 30.0–36.0)
MCV: 89.9 fl (ref 78.0–100.0)
MONOS PCT: 7 % (ref 3.0–12.0)
Monocytes Absolute: 0.5 10*3/uL (ref 0.1–1.0)
NEUTROS ABS: 3.5 10*3/uL (ref 1.4–7.7)
Neutrophils Relative %: 44.9 % (ref 43.0–77.0)
PLATELETS: 245 10*3/uL (ref 150.0–400.0)
RBC: 4.92 Mil/uL (ref 4.22–5.81)
RDW: 13.2 % (ref 11.5–15.5)
WBC: 7.7 10*3/uL (ref 4.0–10.5)

## 2017-06-10 LAB — FERRITIN: Ferritin: 53.7 ng/mL (ref 22.0–322.0)

## 2017-06-10 LAB — HEMOGLOBIN A1C: Hgb A1c MFr Bld: 7.6 % — ABNORMAL HIGH (ref 4.6–6.5)

## 2017-06-13 ENCOUNTER — Ambulatory Visit (INDEPENDENT_AMBULATORY_CARE_PROVIDER_SITE_OTHER): Payer: BLUE CROSS/BLUE SHIELD | Admitting: Family Medicine

## 2017-06-13 ENCOUNTER — Encounter: Payer: Self-pay | Admitting: Family Medicine

## 2017-06-13 VITALS — BP 138/98 | HR 59 | Temp 98.6°F | Ht 65.0 in | Wt 201.0 lb

## 2017-06-13 DIAGNOSIS — Z23 Encounter for immunization: Secondary | ICD-10-CM | POA: Diagnosis not present

## 2017-06-13 DIAGNOSIS — I10 Essential (primary) hypertension: Secondary | ICD-10-CM

## 2017-06-13 DIAGNOSIS — E119 Type 2 diabetes mellitus without complications: Secondary | ICD-10-CM | POA: Diagnosis not present

## 2017-06-13 NOTE — Patient Instructions (Addendum)
Recheck labs prior to a physical in about 4 months.   Don't change your meds for now.  If your BP stays up >130/>90, then let me know.  Take care.  Glad to see you.

## 2017-06-13 NOTE — Progress Notes (Signed)
His wife has to have surgery and that is a financial strain.  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  D/w pt.  He is getting by and still functional.    Diabetes:  Using medications without difficulties:yes Hypoglycemic episodes:no Hyperglycemic episodes:no Feet problems: only with positional numbness after prolonged standing, some better with insert change/replacement.   Blood Sugars averaging: usually ~ 120s usually  eye exam within last year: yes Labs d/w pt.  A1c stable.    Hemochromatosis.  D/w pt.  He is working on diet as much as he can.  Labs are stable, hgb and ferritin, d/w pt.    Meds, vitals, and allergies reviewed.   ROS: Per HPI unless specifically indicated in ROS section   GEN: nad, alert and oriented HEENT: mucous membranes moist NECK: supple w/o LA CV: rrr. PULM: ctab, no inc wob ABD: soft, +bs EXT: no edema SKIN: no acute rash  Diabetic foot exam: Normal inspection No skin breakdown No calluses  Normal DP pulses Normal sensation to light touch and monofilament Nails normal

## 2017-06-15 NOTE — Assessment & Plan Note (Signed)
Blood Sugars averaging: usually ~ 120s usually  eye exam within last year: yes Labs d/w pt.  A1c stable.   Given the amount of upheaval with his wife having to have surgery, his labs are reasonable. Continue work on diet and exercise as much as possible in the meantime.

## 2017-06-15 NOTE — Assessment & Plan Note (Signed)
He will update me if his blood pressure remains elevated.

## 2017-06-15 NOTE — Assessment & Plan Note (Signed)
Reasonable control with acceptable level with ferritin and hemoglobin. Discussed with patient about diet. Recheck labs periodically.

## 2017-10-06 ENCOUNTER — Other Ambulatory Visit: Payer: Self-pay | Admitting: Family Medicine

## 2017-10-06 DIAGNOSIS — E119 Type 2 diabetes mellitus without complications: Secondary | ICD-10-CM

## 2017-10-09 ENCOUNTER — Other Ambulatory Visit (INDEPENDENT_AMBULATORY_CARE_PROVIDER_SITE_OTHER): Payer: BLUE CROSS/BLUE SHIELD

## 2017-10-09 DIAGNOSIS — E119 Type 2 diabetes mellitus without complications: Secondary | ICD-10-CM

## 2017-10-09 LAB — CBC WITH DIFFERENTIAL/PLATELET
BASOS PCT: 0.4 % (ref 0.0–3.0)
Basophils Absolute: 0 10*3/uL (ref 0.0–0.1)
EOS PCT: 3.2 % (ref 0.0–5.0)
Eosinophils Absolute: 0.3 10*3/uL (ref 0.0–0.7)
HCT: 43.2 % (ref 39.0–52.0)
Hemoglobin: 14.9 g/dL (ref 13.0–17.0)
LYMPHS ABS: 2.8 10*3/uL (ref 0.7–4.0)
Lymphocytes Relative: 31.8 % (ref 12.0–46.0)
MCHC: 34.6 g/dL (ref 30.0–36.0)
MCV: 89.1 fl (ref 78.0–100.0)
MONOS PCT: 7.8 % (ref 3.0–12.0)
Monocytes Absolute: 0.7 10*3/uL (ref 0.1–1.0)
NEUTROS PCT: 56.8 % (ref 43.0–77.0)
Neutro Abs: 5 10*3/uL (ref 1.4–7.7)
Platelets: 285 10*3/uL (ref 150.0–400.0)
RBC: 4.84 Mil/uL (ref 4.22–5.81)
RDW: 12.6 % (ref 11.5–15.5)
WBC: 8.8 10*3/uL (ref 4.0–10.5)

## 2017-10-09 LAB — HEMOGLOBIN A1C: Hgb A1c MFr Bld: 7.8 % — ABNORMAL HIGH (ref 4.6–6.5)

## 2017-10-09 LAB — COMPREHENSIVE METABOLIC PANEL
ALT: 79 U/L — AB (ref 0–53)
AST: 38 U/L — ABNORMAL HIGH (ref 0–37)
Albumin: 4.3 g/dL (ref 3.5–5.2)
Alkaline Phosphatase: 66 U/L (ref 39–117)
BUN: 16 mg/dL (ref 6–23)
CO2: 26 meq/L (ref 19–32)
Calcium: 9.3 mg/dL (ref 8.4–10.5)
Chloride: 103 mEq/L (ref 96–112)
Creatinine, Ser: 1.02 mg/dL (ref 0.40–1.50)
GFR: 82.48 mL/min (ref >60.00)
GLUCOSE: 126 mg/dL — AB (ref 70–99)
POTASSIUM: 3.9 meq/L (ref 3.5–5.1)
SODIUM: 138 meq/L (ref 135–145)
Total Bilirubin: 0.6 mg/dL (ref 0.2–1.2)
Total Protein: 7 g/dL (ref 6.0–8.3)

## 2017-10-09 LAB — LIPID PANEL
CHOL/HDL RATIO: 4
Cholesterol: 123 mg/dL (ref 0–200)
HDL: 32.9 mg/dL — ABNORMAL LOW (ref >39.00)
LDL Cholesterol: 73 mg/dL (ref 0–99)
NonHDL: 90.44
Triglycerides: 87 mg/dL (ref 0.0–149.0)
VLDL: 17.4 mg/dL (ref 0.0–40.0)

## 2017-10-09 LAB — MICROALBUMIN / CREATININE URINE RATIO
Creatinine,U: 208.5 mg/dL
MICROALB UR: 1.5 mg/dL (ref 0.0–1.9)
Microalb Creat Ratio: 0.7 mg/g (ref 0.0–30.0)

## 2017-10-09 LAB — FERRITIN: Ferritin: 63.8 ng/mL (ref 22.0–322.0)

## 2017-10-14 ENCOUNTER — Encounter: Payer: Self-pay | Admitting: Family Medicine

## 2017-10-14 ENCOUNTER — Ambulatory Visit (INDEPENDENT_AMBULATORY_CARE_PROVIDER_SITE_OTHER): Payer: BLUE CROSS/BLUE SHIELD | Admitting: Family Medicine

## 2017-10-14 VITALS — BP 140/90 | HR 71 | Temp 98.6°F | Wt 198.0 lb

## 2017-10-14 DIAGNOSIS — Z7189 Other specified counseling: Secondary | ICD-10-CM

## 2017-10-14 DIAGNOSIS — Z0000 Encounter for general adult medical examination without abnormal findings: Secondary | ICD-10-CM

## 2017-10-14 DIAGNOSIS — E119 Type 2 diabetes mellitus without complications: Secondary | ICD-10-CM

## 2017-10-14 DIAGNOSIS — I10 Essential (primary) hypertension: Secondary | ICD-10-CM

## 2017-10-14 NOTE — Patient Instructions (Addendum)
It would be reasonable to give blood once in the near future and we can recheck ferritin and A1c in about 4 months.   Keep working on diet and exercise in the meantime.   Take care.  Glad to see you.  If your BP stays above 140/90 then update me.

## 2017-10-14 NOTE — Progress Notes (Signed)
CPE- See plan.  Routine anticipatory guidance given to patient.  See health maintenance.  The possibility exists that previously documented standard health maintenance information may have been brought forward from a previous encounter into this note.  If needed, that same information has been updated to reflect the current situation based on today's encounter.    Tetanus 2016 Flu shot 2018 PNA 2014 Shingles shot not due.  Colon and prostate cancer screening not due. Diet and exercise d/w pt. Complicated by working nights. He's still working on diet, to get his ferritin stable.  Encouraged exercise, done mainly at work, he is walking at work. Living will d/w pt. Would have his wife designated if patient were incapacitated.   His wife's grandmother recently died and that affected his time at work, which affected his finances (which was already complicated by costs related to his wife's foot surgery).  He needs brakes and struts and tires done on his car.  He is trying to manage the strain of all of this, and is still getting by.  He has hope this can get better.  No SI/HI.  I can't imagine that this doesn't affect his BP, d/w pt.  Recheck BP 140/90.    Diabetes:  Using medications without difficulties:yes Hypoglycemic episodes:no Hyperglycemic episodes:no Feet problems:no Blood Sugars averaging: 100-120s eye exam within last year: yes  A1c d/w pt.  He is going to cut back on candy- there has been more of that around the house with holidays.    Hemachromatosis.  D/w pt about diet and exercise and labs. See AVS.  It would be reasonable to give blood once in the near future and we can recheck ferritin and A1c in about 4 months.  No new abd sx.    PMH and SH reviewed  Meds, vitals, and allergies reviewed.   ROS: Per HPI.  Unless specifically indicated otherwise in HPI, the patient denies:  General: fever. Eyes: acute vision changes ENT: sore throat Cardiovascular: chest  pain Respiratory: SOB GI: vomiting GU: dysuria Musculoskeletal: acute back pain Derm: acute rash Neuro: acute motor dysfunction Psych: worsening mood Endocrine: polydipsia Heme: bleeding Allergy: hayfever  GEN: nad, alert and oriented HEENT: mucous membranes moist NECK: supple w/o LA CV: rrr. PULM: ctab, no inc wob ABD: soft, +bs EXT: no edema SKIN: no acute rash  Diabetic foot exam: Normal inspection No skin breakdown No calluses  Normal DP pulses Normal sensation to light touch and monofilament Nails normal

## 2017-10-15 NOTE — Assessment & Plan Note (Signed)
Recheck BP was better, with multiple social stressors likely affecting his pressure.  He is going to the best he can with diet and exercise in the meantime.  We did not change his medicines at this point. He agrees.

## 2017-10-15 NOTE — Assessment & Plan Note (Signed)
Living will d/w pt.   Would have his wife designated if patient were incapacitated.  

## 2017-10-15 NOTE — Assessment & Plan Note (Signed)
Tetanus 2016 Flu shot 2018 PNA 2014 Shingles shot not due.  Colon and prostate cancer screening not due. Diet and exercise d/w pt. Complicated by working nights. He's still working on diet, to get his ferritin stable.  Encouraged exercise, done mainly at work, he is walking at work. Living will d/w pt. Would have his wife designated if patient were incapacitated.

## 2017-10-15 NOTE — Assessment & Plan Note (Signed)
A1c d/w pt.  He is going to cut back on candy- there has been more of that around the house with holidays.   Recheck in about 4 months.  See notes on labs.  Normal foot exam.

## 2017-10-15 NOTE — Assessment & Plan Note (Signed)
D/w pt about diet and exercise and labs. See AVS.  It would be reasonable to give blood once in the near future and we can recheck ferritin and A1c in about 4 months.   AST is better, ALT is stable.  No jaundice, benign abd exam, not ttp and normal BS.

## 2017-11-16 ENCOUNTER — Other Ambulatory Visit: Payer: Self-pay | Admitting: Family Medicine

## 2018-02-11 ENCOUNTER — Other Ambulatory Visit (INDEPENDENT_AMBULATORY_CARE_PROVIDER_SITE_OTHER): Payer: BLUE CROSS/BLUE SHIELD

## 2018-02-11 ENCOUNTER — Other Ambulatory Visit: Payer: Self-pay | Admitting: Family Medicine

## 2018-02-11 DIAGNOSIS — E119 Type 2 diabetes mellitus without complications: Secondary | ICD-10-CM

## 2018-02-11 LAB — HEMOGLOBIN A1C: HEMOGLOBIN A1C: 7.9 % — AB (ref 4.6–6.5)

## 2018-02-11 LAB — FERRITIN: Ferritin: 53 ng/mL (ref 22.0–322.0)

## 2018-02-11 NOTE — Telephone Encounter (Signed)
Electronic refill request Last office visit 10/14/17 Last refill 02/10/17 #45/3 See allergycontraindication

## 2018-02-12 NOTE — Telephone Encounter (Signed)
Sent. Thanks.   

## 2018-02-13 ENCOUNTER — Encounter: Payer: Self-pay | Admitting: Family Medicine

## 2018-02-13 ENCOUNTER — Ambulatory Visit: Payer: BLUE CROSS/BLUE SHIELD | Admitting: Family Medicine

## 2018-02-13 VITALS — BP 182/104 | HR 62 | Temp 98.3°F | Ht 65.0 in | Wt 196.5 lb

## 2018-02-13 DIAGNOSIS — I10 Essential (primary) hypertension: Secondary | ICD-10-CM | POA: Diagnosis not present

## 2018-02-13 DIAGNOSIS — E119 Type 2 diabetes mellitus without complications: Secondary | ICD-10-CM

## 2018-02-13 MED ORDER — LISINOPRIL 5 MG PO TABS: 5.0000 mg | ORAL_TABLET | Freq: Every day | ORAL | 3 refills | Status: DC

## 2018-02-13 NOTE — Progress Notes (Signed)
Diabetes:  Using medications without difficulties: he has been missing some doses of metformin.  We talked about setting an alarm, etc.  Adherence would help with his A1c.   Hypoglycemic episodes:no Hyperglycemic episodes:no Feet problems: he has some L plantar fascia pain with pain on standing after prolonged sitting.  He has to wear steel toe shoes.  D/w pt about stretching.   No tingling.   Blood Sugars averaging: usually ~120s eye exam within last year: d/w pt.  F/u pending.   Labs d/w pt.    BP elevation.  No CP.  Still on metoprolol.  Had been elevated at home but not as high as today.    Hemochromatosis.  Ferritin reasonable.  D/w pt about diet.  He isn't having to give blood, meaning no recent donation.    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  His car is fixed, his wife is doing better.    Meds, vitals, and allergies reviewed.   ROS: Per HPI unless specifically indicated in ROS section   GEN: nad, alert and oriented HEENT: mucous membranes moist NECK: supple w/o LA CV: rrr. PULM: ctab, no inc wob ABD: soft, +bs EXT: no edema SKIN: no acute rash

## 2018-02-13 NOTE — Patient Instructions (Signed)
Recheck in about 3 months.  Labs ahead of time.  Add on lisinopril 5mg  a day.  If persistently >140/>90, then go up to 10mg  a day after about 1 week.  Update me as needed.  Get full length soft arch support inserts.  Take care.  Glad to see you.

## 2018-02-15 NOTE — Assessment & Plan Note (Deleted)
He has been missing some doses of metformin.  We talked about setting an alarm, etc.  Adherence would help with his A1c.  He'll work on that recheck in about 3 months.  He agrees.  He'll get shoe insets to help with foot pain, dw pt.  Update me as needed.

## 2018-02-15 NOTE — Assessment & Plan Note (Signed)
Ferritin reasonable.  Continue work on diet and weight.  He agrees.  Recheck periodically.

## 2018-02-15 NOTE — Assessment & Plan Note (Signed)
He has been missing some doses of metformin.  We talked about setting an alarm, etc.  Adherence would help with his A1c.  He'll work on that recheck in about 3 months.  He agrees.  He'll get shoe insets to help with foot pain, dw pt.  Update me as needed.

## 2018-02-15 NOTE — Assessment & Plan Note (Signed)
Add on lisinopril 5mg  a day.  If persistently >140/>90, then go up to 10mg  a day after about 1 week.  Update me as needed.  He agrees.

## 2018-05-11 ENCOUNTER — Other Ambulatory Visit: Payer: Self-pay | Admitting: Family Medicine

## 2018-05-11 DIAGNOSIS — E119 Type 2 diabetes mellitus without complications: Secondary | ICD-10-CM

## 2018-05-14 ENCOUNTER — Other Ambulatory Visit (INDEPENDENT_AMBULATORY_CARE_PROVIDER_SITE_OTHER): Payer: BLUE CROSS/BLUE SHIELD

## 2018-05-14 DIAGNOSIS — E119 Type 2 diabetes mellitus without complications: Secondary | ICD-10-CM

## 2018-05-14 LAB — FERRITIN: FERRITIN: 60.3 ng/mL (ref 22.0–322.0)

## 2018-05-14 LAB — HEMOGLOBIN A1C: HEMOGLOBIN A1C: 7.5 % — AB (ref 4.6–6.5)

## 2018-05-18 ENCOUNTER — Encounter: Payer: Self-pay | Admitting: Family Medicine

## 2018-05-18 ENCOUNTER — Ambulatory Visit: Payer: BLUE CROSS/BLUE SHIELD | Admitting: Family Medicine

## 2018-05-18 VITALS — BP 138/88 | HR 64 | Temp 98.4°F | Ht 65.0 in | Wt 194.8 lb

## 2018-05-18 DIAGNOSIS — M7060 Trochanteric bursitis, unspecified hip: Secondary | ICD-10-CM

## 2018-05-18 DIAGNOSIS — Z23 Encounter for immunization: Secondary | ICD-10-CM | POA: Diagnosis not present

## 2018-05-18 DIAGNOSIS — E119 Type 2 diabetes mellitus without complications: Secondary | ICD-10-CM | POA: Diagnosis not present

## 2018-05-18 DIAGNOSIS — F419 Anxiety disorder, unspecified: Secondary | ICD-10-CM | POA: Diagnosis not present

## 2018-05-18 MED ORDER — LISINOPRIL 5 MG PO TABS
5.0000 mg | ORAL_TABLET | Freq: Every day | ORAL | Status: DC
Start: 2018-05-18 — End: 2019-01-11

## 2018-05-18 NOTE — Addendum Note (Signed)
Addended by: Emelia Salisbury C on: 05/18/2018 09:57 AM   Modules accepted: Orders

## 2018-05-18 NOTE — Progress Notes (Signed)
Diabetes:  Using medications without difficulties: yes Hypoglycemic episodes:no Hyperglycemic episodes:no Feet problems:no, better with inserts.   Blood Sugars averaging: usually ~130s on episodic checks.   eye exam within last year: due,d/w pt.   A1c some better at 7.5.   He is using alarms on his phone to remind him to take his meds.   He is taking 5mg  lisinopril.  No ADE on med.  BP has been 130s/80s, occ lower.   He slept wrong and then had episodic R greater troch area pain.  Hasn't tried icing.  Better now.  More pain laying on R side at night.    Ferritin similar to prev.  D/w pt about diet.  No abd pain.    His daughter just turned 13, in dance.  His son is in preschool, T ball.    More anxiety in the last months.  He has family and financial concerns.  He is still working 3rd shift.  He is worried about his son at practice, with his son's interactions with other kids, etc.  He is introverted at baseline and that makes conversation with the other parents more difficult.  He tries to have some baseline time alone.   He is still working 3rd shift.  He still enjoys working on models.  He is walking at work, limited exercise o/w.  No Si/Hi.  His wife is supportive.    Flu shot today.   Meds, vitals, and allergies reviewed.  ROS: Per HPI unless specifically indicated in ROS section   GEN: nad, alert and oriented HEENT: mucous membranes moist NECK: supple w/o LA CV: rrr. PULM: ctab, no inc wob ABD: soft, +bs EXT: no edema SKIN: no acute rash Slightly ttp near R greater troch area but normal R hip ROM.

## 2018-05-18 NOTE — Patient Instructions (Signed)
Use ice for likely trochanteric bursitis and try to get more exercise out of work.  Recheck labs in about 3 months, prior to a visit.  Flu shot today.  Take care.  Glad to see you.  Update me as needed.

## 2018-05-18 NOTE — Assessment & Plan Note (Signed)
He'll continue work on diet and exercise.  A1c not at goal but some better.  Recheck in a few months.  Continue statin and ACE.  He agrees.

## 2018-05-18 NOTE — Assessment & Plan Note (Signed)
Ferritin reasonable, d/w pt about diet and labs.  Recheck periodically.  He agrees.

## 2018-05-18 NOTE — Assessment & Plan Note (Signed)
D/w pt about icing and stretching and anatomy.  Some better already, update me as needed. He agrees.

## 2018-05-18 NOTE — Assessment & Plan Note (Signed)
D/w pt.  No Si/Hi.  Stressors discussed along with mgmt techniques.  Exercise would be beneficial, d/w pt.  He wants to avoid extra meds.  Okay for outpatient f/u. He agrees. He'll update me as needed.

## 2018-07-29 ENCOUNTER — Other Ambulatory Visit: Payer: Self-pay | Admitting: *Deleted

## 2018-07-29 MED ORDER — METFORMIN HCL 500 MG PO TABS: 1000.0000 mg | ORAL_TABLET | Freq: Two times a day (BID) | ORAL | 0 refills | Status: DC

## 2018-07-29 NOTE — Telephone Encounter (Signed)
Received refill request for Metformin  Sent electronically

## 2018-08-10 ENCOUNTER — Other Ambulatory Visit: Payer: Self-pay | Admitting: Family Medicine

## 2018-08-10 DIAGNOSIS — E119 Type 2 diabetes mellitus without complications: Secondary | ICD-10-CM

## 2018-08-11 ENCOUNTER — Other Ambulatory Visit (INDEPENDENT_AMBULATORY_CARE_PROVIDER_SITE_OTHER): Payer: BLUE CROSS/BLUE SHIELD

## 2018-08-11 DIAGNOSIS — E119 Type 2 diabetes mellitus without complications: Secondary | ICD-10-CM | POA: Diagnosis not present

## 2018-08-11 LAB — HEMOGLOBIN A1C: Hgb A1c MFr Bld: 7.8 % — ABNORMAL HIGH (ref 4.6–6.5)

## 2018-08-11 LAB — FERRITIN: Ferritin: 102.2 ng/mL (ref 22.0–322.0)

## 2018-08-14 ENCOUNTER — Encounter: Payer: Self-pay | Admitting: Family Medicine

## 2018-08-14 ENCOUNTER — Ambulatory Visit: Payer: BLUE CROSS/BLUE SHIELD | Admitting: Family Medicine

## 2018-08-14 DIAGNOSIS — E119 Type 2 diabetes mellitus without complications: Secondary | ICD-10-CM

## 2018-08-14 NOTE — Patient Instructions (Addendum)
Recheck in about 3 months with labs prior to a physical.  See about giving blood in the meantime.   Don't change your meds for now.  Take care.  Glad to see you.

## 2018-08-14 NOTE — Progress Notes (Signed)
Diabetes:  Using medications without difficulties: yes Hypoglycemic episodes: no Hyperglycemic episodes: no Feet problems: no Blood Sugars averaging: not checked lately.   eye exam within last year: d/w pt.  Scheduled.   Labs d/w pt. A1c still slightly above goal.    Ferritin up from prev.  D/w pt about diet.  No abd pain.  no recent phlebotomy.  See avs.    We talked about recent stressors, with holidays included.    Meds, vitals, and allergies reviewed.  ROS: Per HPI unless specifically indicated in ROS section   GEN: nad, alert and oriented HEENT: mucous membranes moist NECK: supple w/o LA CV: rrr. PULM: ctab, no inc wob ABD: soft, +bs EXT: no edema SKIN: no acute rash

## 2018-08-16 NOTE — Assessment & Plan Note (Signed)
Ferritin is up.  Recheck labs before physical in about 3 months.  He will see about given blood in the meantime.  He is trying to stick with a diet appropriate for hemochromatosis.  He agrees with plan.

## 2018-08-16 NOTE — Assessment & Plan Note (Signed)
A1c still slightly above goal.  Discussed with patient about options.  He is going to continue current medication as is and work on diet and exercise.  Recheck labs in about 3 months before physical.  He agrees.

## 2018-08-31 LAB — HM DIABETES EYE EXAM

## 2018-09-17 ENCOUNTER — Encounter: Payer: Self-pay | Admitting: Family Medicine

## 2018-11-10 ENCOUNTER — Other Ambulatory Visit: Payer: Self-pay | Admitting: Family Medicine

## 2018-11-12 ENCOUNTER — Encounter: Payer: Self-pay | Admitting: Family Medicine

## 2018-11-12 ENCOUNTER — Other Ambulatory Visit: Payer: Self-pay | Admitting: Family Medicine

## 2018-11-12 DIAGNOSIS — E119 Type 2 diabetes mellitus without complications: Secondary | ICD-10-CM

## 2018-11-16 ENCOUNTER — Other Ambulatory Visit (INDEPENDENT_AMBULATORY_CARE_PROVIDER_SITE_OTHER): Payer: BLUE CROSS/BLUE SHIELD

## 2018-11-16 ENCOUNTER — Other Ambulatory Visit: Payer: Self-pay

## 2018-11-16 DIAGNOSIS — E119 Type 2 diabetes mellitus without complications: Secondary | ICD-10-CM | POA: Diagnosis not present

## 2018-11-16 LAB — COMPREHENSIVE METABOLIC PANEL
ALBUMIN: 4.5 g/dL (ref 3.5–5.2)
ALK PHOS: 72 U/L (ref 39–117)
ALT: 116 U/L — ABNORMAL HIGH (ref 0–53)
AST: 62 U/L — ABNORMAL HIGH (ref 0–37)
BUN: 20 mg/dL (ref 6–23)
CO2: 25 mEq/L (ref 19–32)
Calcium: 9.4 mg/dL (ref 8.4–10.5)
Chloride: 104 mEq/L (ref 96–112)
Creatinine, Ser: 0.85 mg/dL (ref 0.40–1.50)
GFR: 95.34 mL/min (ref >60.00)
Glucose, Bld: 132 mg/dL — ABNORMAL HIGH (ref 70–99)
Potassium: 4.1 mEq/L (ref 3.5–5.1)
Sodium: 138 mEq/L (ref 135–145)
TOTAL PROTEIN: 7.1 g/dL (ref 6.0–8.3)
Total Bilirubin: 0.5 mg/dL (ref 0.2–1.2)

## 2018-11-16 LAB — FERRITIN: Ferritin: 71.8 ng/mL (ref 22.0–322.0)

## 2018-11-16 LAB — LIPID PANEL
Cholesterol: 151 mg/dL (ref 0–200)
HDL: 35.9 mg/dL — ABNORMAL LOW (ref >39.00)
LDL Cholesterol: 83 mg/dL (ref 0–99)
NonHDL: 115.09
Total CHOL/HDL Ratio: 4
Triglycerides: 160 mg/dL — ABNORMAL HIGH (ref 0.0–149.0)
VLDL: 32 mg/dL (ref 0.0–40.0)

## 2018-11-16 LAB — CBC WITH DIFFERENTIAL/PLATELET
BASOS PCT: 0.4 % (ref 0.0–3.0)
Basophils Absolute: 0 10*3/uL (ref 0.0–0.1)
Eosinophils Absolute: 0.3 10*3/uL (ref 0.0–0.7)
Eosinophils Relative: 3.7 % (ref 0.0–5.0)
HCT: 43 % (ref 39.0–52.0)
HEMOGLOBIN: 15 g/dL (ref 13.0–17.0)
Lymphocytes Relative: 43.4 % (ref 12.0–46.0)
Lymphs Abs: 3.1 10*3/uL (ref 0.7–4.0)
MCHC: 35 g/dL (ref 30.0–36.0)
MCV: 88 fl (ref 78.0–100.0)
Monocytes Absolute: 0.5 10*3/uL (ref 0.1–1.0)
Monocytes Relative: 7.6 % (ref 3.0–12.0)
Neutro Abs: 3.2 10*3/uL (ref 1.4–7.7)
Neutrophils Relative %: 44.9 % (ref 43.0–77.0)
Platelets: 288 10*3/uL (ref 150.0–400.0)
RBC: 4.88 Mil/uL (ref 4.22–5.81)
RDW: 13 % (ref 11.5–15.5)
WBC: 7.2 10*3/uL (ref 4.0–10.5)

## 2018-11-16 LAB — HEMOGLOBIN A1C: HEMOGLOBIN A1C: 8.1 % — AB (ref 4.6–6.5)

## 2018-11-19 ENCOUNTER — Encounter: Payer: BLUE CROSS/BLUE SHIELD | Admitting: Family Medicine

## 2018-11-19 ENCOUNTER — Encounter: Payer: Self-pay | Admitting: Family Medicine

## 2018-11-19 ENCOUNTER — Other Ambulatory Visit: Payer: Self-pay

## 2018-11-19 ENCOUNTER — Ambulatory Visit (INDEPENDENT_AMBULATORY_CARE_PROVIDER_SITE_OTHER): Payer: BLUE CROSS/BLUE SHIELD | Admitting: Family Medicine

## 2018-11-19 DIAGNOSIS — K760 Fatty (change of) liver, not elsewhere classified: Secondary | ICD-10-CM

## 2018-11-19 DIAGNOSIS — Z1211 Encounter for screening for malignant neoplasm of colon: Secondary | ICD-10-CM

## 2018-11-19 DIAGNOSIS — E7800 Pure hypercholesterolemia, unspecified: Secondary | ICD-10-CM | POA: Diagnosis not present

## 2018-11-19 DIAGNOSIS — E119 Type 2 diabetes mellitus without complications: Secondary | ICD-10-CM

## 2018-11-19 MED ORDER — GLIMEPIRIDE 1 MG PO TABS: ORAL_TABLET | ORAL | 3 refills | Status: DC

## 2018-11-19 NOTE — Assessment & Plan Note (Signed)
Hemochromatosis- He'll see about blood donation.  Refer to GI.  He'll continue work on diet.  Labs d/w pt.

## 2018-11-19 NOTE — Assessment & Plan Note (Signed)
Continue statin.  Continue work on diet and execise. Refer to GI.

## 2018-11-19 NOTE — Progress Notes (Signed)
Virtual Visit via Telephone Note  I connected with patient on 11/19/18 at 825  by telephone and verified that I am speaking with the correct person using two identifiers.  Location of patient: home  Location of MD: [place] of referring provider (if blank then none associated): Names per persons and role in encounter:  MD: Earlyne Iba, Patient: Micheal Cruz.    I discussed the limitations, risks, security and privacy concerns of performing an evaluation and management service by telephone and the availability of in person appointments. I also discussed with the patient that there may be a patient responsible charge related to this service. The patient expressed understanding and agreed to proceed.  History of Present Illness:   Diabetes:  Using medications without difficulties: yes Hypoglycemic episodes: no Hyperglycemic episodes: no Feet problems: no Blood Sugars averaging: not checked often.   eye exam within last year: yes Labs d/w pt.   Still working 3rd shift.  D/w pt about diet.    Elevated Cholesterol: Using medications without problems: yes Muscle aches: not at current dose, this is likely max tolerated dose Diet compliance: d/w pt.  "Not as good as I should be."   Exercise: d/w pt.    Hemochromatosis. LFT elevation noted, slightly worse than prev.  Ferritin is improved from prev.  D/w pt.  [facility]  We talked about blood donation and GI f/u re: hemochromatosis and colon cancer screening.   He has some cracking on the R 3rd PIP with ROM.  Not painful but noted by patient.  I can recheck in person later on, he'll update me as needed.   Observations/Objective: Speaking in complete sentences.   NAD  Assessment and Plan:  DM2.  Add on glimepiride.  Continue work on diet and exercise.  Recheck in about 3 months.  He'll call about about follow up/scheduling.    HLD.  Continue statin.  Continue work on diet and  exercise.   Hemochromatosis- He'll see about blood donation.  Refer to GI.  He'll continue work on diet.   Follow Up Instructions: Recheck in about 3 months.  He'll call about about follow up/scheduling.    I discussed the assessment and treatment plan with the patient. The patient was provided an opportunity to ask questions and all were answered. The patient agreed with the plan and demonstrated an understanding of the instructions.   The patient was advised to call back or seek an in-person evaluation if the symptoms worsen or if the condition fails to improve as anticipated.  I provided 17 minutes of non-face-to-face time during this encounter.  Elsie Stain, MD

## 2018-11-19 NOTE — Assessment & Plan Note (Signed)
  HLD.  Continue statin.  Continue work on diet and exercise.

## 2018-11-19 NOTE — Assessment & Plan Note (Signed)
DM2.  Add on glimepiride.  Continue work on diet and exercise.  Recheck in about 3 months.  He'll call about about follow up/scheduling.

## 2018-12-30 ENCOUNTER — Other Ambulatory Visit: Payer: Self-pay | Admitting: Family Medicine

## 2018-12-30 MED ORDER — METFORMIN HCL 500 MG PO TABS: 1000.0000 mg | ORAL_TABLET | Freq: Two times a day (BID) | ORAL | 1 refills | Status: DC

## 2019-01-11 ENCOUNTER — Encounter: Payer: Self-pay | Admitting: Family Medicine

## 2019-01-11 MED ORDER — METOPROLOL SUCCINATE ER 50 MG PO TB24: 75.0000 mg | ORAL_TABLET | Freq: Every day | ORAL | 1 refills | Status: DC

## 2019-01-11 MED ORDER — PRAVASTATIN SODIUM 20 MG PO TABS: ORAL_TABLET | ORAL | 3 refills | Status: DC

## 2019-01-11 MED ORDER — LANSOPRAZOLE 15 MG PO CPDR: 15.0000 mg | DELAYED_RELEASE_CAPSULE | Freq: Every day | ORAL | Status: DC

## 2019-01-11 MED ORDER — LISINOPRIL 5 MG PO TABS: 5.0000 mg | ORAL_TABLET | Freq: Every day | ORAL | 1 refills | Status: DC

## 2019-01-11 MED ORDER — METFORMIN HCL 500 MG PO TABS: 1000.0000 mg | ORAL_TABLET | Freq: Two times a day (BID) | ORAL | 1 refills | Status: DC

## 2019-01-11 MED ORDER — GLIMEPIRIDE 1 MG PO TABS: ORAL_TABLET | ORAL | 3 refills | Status: DC

## 2019-02-05 ENCOUNTER — Encounter: Payer: Self-pay | Admitting: Family Medicine

## 2019-02-05 ENCOUNTER — Ambulatory Visit (INDEPENDENT_AMBULATORY_CARE_PROVIDER_SITE_OTHER): Payer: 59 | Admitting: Family Medicine

## 2019-02-05 DIAGNOSIS — Z7189 Other specified counseling: Secondary | ICD-10-CM

## 2019-02-05 DIAGNOSIS — E119 Type 2 diabetes mellitus without complications: Secondary | ICD-10-CM | POA: Diagnosis not present

## 2019-02-05 DIAGNOSIS — E7800 Pure hypercholesterolemia, unspecified: Secondary | ICD-10-CM

## 2019-02-05 DIAGNOSIS — I10 Essential (primary) hypertension: Secondary | ICD-10-CM

## 2019-02-05 DIAGNOSIS — Z0000 Encounter for general adult medical examination without abnormal findings: Secondary | ICD-10-CM | POA: Diagnosis not present

## 2019-02-05 DIAGNOSIS — Z125 Encounter for screening for malignant neoplasm of prostate: Secondary | ICD-10-CM | POA: Diagnosis not present

## 2019-02-05 NOTE — Progress Notes (Signed)
Virtual visit completed through WebEx or similar program Patient location: home  Provider location: Financial controller at [facility], office   Pandemic considerations d/w pt.   Limitations and rationale for visit method d/w patient.  Patient agreed to proceed.   CC:  HPI:  Tetanus 2016 Flu shot 2019 PNA 2014 Shingles shot out of stock.  D/w patient OB:[id number] for colon cancer screening, including IFOB vs. colonoscopy.  Risks and benefits of both were discussed and patient voiced understanding.  Pt elects for: he is going to see GI about colonoscopy when possible.   Prostate cancer screening and PSA options (with potential risks and benefits of testing vs not testing) were discussed along with recent recs/guidelines.  FH noted, reasonable to check.  Diet and exercise d/w pt. Complicated by working nights.  Encouraged exercise, done mainly at work, he is walking at work. Living will d/w pt. Would have his wife designated if patient were incapacitated.   Hemochromatosis. He's still working on diet, to get his ferritin stable.  Discussed with patient.  Previous labs discussed with patient.  Elevated Cholesterol: Using medications without problems: yes Muscle aches: no Diet compliance: encouraged.  Exercise:encouraged  Hypertension:    Using medication without problems or lightheadedness: yes Chest pain with exertion:no Edema:no Short of breath:no Pulse 66 today, 130/80s usually.    Diabetes:  Using medications without difficulties: yes Hypoglycemic episodes: no Hyperglycemic episodes: no Feet problems: no Blood Sugars averaging: not checked often eye exam within last year: yes Prev labs d/w pt.  Repeat labs pending.    She has had some mild/resolving sinus pressure.    Past medical history, social history, family history reviewed.  Meds and allergies reviewed.   ROS: Per HPI unless specifically indicated in ROS section   NAD Speech wnl  A/P:  Tetanus 2016 Flu shot  2019 PNA 2014 Shingles shot out of stock.  D/w patient OQ:HUTMLYY for colon cancer screening, including IFOB vs. colonoscopy.  Risks and benefits of both were discussed and patient voiced understanding.  Pt elects for: he is going to see GI about colonoscopy when possible.   Prostate cancer screening and PSA options (with potential risks and benefits of testing vs not testing) were discussed along with recent recs/guidelines.  FH noted, reasonable to check.  Diet and exercise d/w pt. Complicated by working nights.  Encouraged exercise, done mainly at work, he is walking at work. Living will d/w pt. Would have his wife designated if patient were incapacitated.   Hemochromatosis. He's still working on diet, to get his ferritin stable.  Discussed with patient.  Previous labs discussed with patient.  Elevated Cholesterol: Using medications without problems: yes No change in meds.  Follow-up labs pending.  Hypertension:    No change in meds.  Follow-up labs pending. Pulse 66 today, 130/80s usually.    Diabetes:  No change in meds at this point.  Follow-up labs pending. Prev labs d/w pt. diet and exercise discussed with patient.

## 2019-02-07 NOTE — Assessment & Plan Note (Signed)
  Tetanus 2016 Flu shot 2019 PNA 2014 Shingles shot out of stock.  D/w patient OQ:HUTMLYY for colon cancer screening, including IFOB vs. colonoscopy.  Risks and benefits of both were discussed and patient voiced understanding.  Pt elects for: he is going to see GI about colonoscopy when possible.   Prostate cancer screening and PSA options (with potential risks and benefits of testing vs not testing) were discussed along with recent recs/guidelines.  FH noted, reasonable to check.  Diet and exercise d/w pt. Complicated by working nights.  Encouraged exercise, done mainly at work, he is walking at work. Living will d/w pt. Would have his wife designated if patient were incapacitated.

## 2019-02-07 NOTE — Assessment & Plan Note (Signed)
Living will d/w pt.   Would have his wife designated if patient were incapacitated.  

## 2019-02-07 NOTE — Assessment & Plan Note (Signed)
He's still working on diet, to get his ferritin stable.  Discussed with patient.  Previous labs discussed with patient. Follow-up labs pending.

## 2019-02-07 NOTE — Assessment & Plan Note (Signed)
No change in meds.  Follow-up labs pending.

## 2019-02-07 NOTE — Assessment & Plan Note (Signed)
No change in meds at this point.  Follow-up labs pending. Prev labs d/w pt. diet and exercise discussed with patient.

## 2019-02-08 ENCOUNTER — Other Ambulatory Visit (INDEPENDENT_AMBULATORY_CARE_PROVIDER_SITE_OTHER): Payer: 59

## 2019-02-08 DIAGNOSIS — Z125 Encounter for screening for malignant neoplasm of prostate: Secondary | ICD-10-CM | POA: Diagnosis not present

## 2019-02-08 DIAGNOSIS — E119 Type 2 diabetes mellitus without complications: Secondary | ICD-10-CM

## 2019-02-08 LAB — COMPREHENSIVE METABOLIC PANEL
ALT: 73 U/L — ABNORMAL HIGH (ref 0–53)
AST: 42 U/L — ABNORMAL HIGH (ref 0–37)
Albumin: 4.3 g/dL (ref 3.5–5.2)
Alkaline Phosphatase: 69 U/L (ref 39–117)
BUN: 23 mg/dL (ref 6–23)
CO2: 25 mEq/L (ref 19–32)
Calcium: 9.4 mg/dL (ref 8.4–10.5)
Chloride: 103 mEq/L (ref 96–112)
Creatinine, Ser: 1.1 mg/dL (ref 0.40–1.50)
GFR: 70.74 mL/min (ref >60.00)
Glucose, Bld: 150 mg/dL — ABNORMAL HIGH (ref 70–99)
Potassium: 4.5 mEq/L (ref 3.5–5.1)
Sodium: 138 mEq/L (ref 135–145)
Total Bilirubin: 0.4 mg/dL (ref 0.2–1.2)
Total Protein: 6.5 g/dL (ref 6.0–8.3)

## 2019-02-08 LAB — CBC WITH DIFFERENTIAL/PLATELET
Basophils Absolute: 0.1 10*3/uL (ref 0.0–0.1)
Basophils Relative: 0.7 % (ref 0.0–3.0)
Eosinophils Absolute: 0.7 10*3/uL (ref 0.0–0.7)
Eosinophils Relative: 8.2 % — ABNORMAL HIGH (ref 0.0–5.0)
HCT: 41.3 % (ref 39.0–52.0)
Hemoglobin: 14.2 g/dL (ref 13.0–17.0)
Lymphocytes Relative: 39.7 % (ref 12.0–46.0)
Lymphs Abs: 3.2 10*3/uL (ref 0.7–4.0)
MCHC: 34.3 g/dL (ref 30.0–36.0)
MCV: 89.8 fl (ref 78.0–100.0)
Monocytes Absolute: 0.5 10*3/uL (ref 0.1–1.0)
Monocytes Relative: 6.6 % (ref 3.0–12.0)
Neutro Abs: 3.6 10*3/uL (ref 1.4–7.7)
Neutrophils Relative %: 44.8 % (ref 43.0–77.0)
Platelets: 322 10*3/uL (ref 150.0–400.0)
RBC: 4.6 Mil/uL (ref 4.22–5.81)
RDW: 12.8 % (ref 11.5–15.5)
WBC: 8.1 10*3/uL (ref 4.0–10.5)

## 2019-02-08 LAB — FERRITIN: Ferritin: 56.3 ng/mL (ref 22.0–322.0)

## 2019-02-08 LAB — HEMOGLOBIN A1C: Hgb A1c MFr Bld: 6.9 % — ABNORMAL HIGH (ref 4.6–6.5)

## 2019-02-08 LAB — PSA: PSA: 0.6 ng/mL (ref 0.10–4.00)

## 2019-02-13 ENCOUNTER — Other Ambulatory Visit: Payer: Self-pay | Admitting: Family Medicine

## 2019-04-07 ENCOUNTER — Encounter: Payer: Self-pay | Admitting: Gastroenterology

## 2019-04-26 ENCOUNTER — Other Ambulatory Visit: Payer: Self-pay | Admitting: Family Medicine

## 2019-04-29 ENCOUNTER — Other Ambulatory Visit: Payer: Self-pay

## 2019-04-29 ENCOUNTER — Ambulatory Visit (AMBULATORY_SURGERY_CENTER): Payer: Self-pay | Admitting: *Deleted

## 2019-04-29 VITALS — Temp 97.1°F | Ht 65.0 in | Wt 196.0 lb

## 2019-04-29 DIAGNOSIS — Z1211 Encounter for screening for malignant neoplasm of colon: Secondary | ICD-10-CM

## 2019-04-29 MED ORDER — SUPREP BOWEL PREP KIT 17.5-3.13-1.6 GM/177ML PO SOLN: 1.0000 | Freq: Once | ORAL | 0 refills | Status: AC

## 2019-04-29 NOTE — Progress Notes (Signed)
No egg or soy allergy known to patient  No issues with past sedation with any surgeries  or procedures, no intubation problems  No diet pills per patient No home 02 use per patient  No blood thinners per patient  Pt denies issues with constipation  No A fib or A flutter  EMMI video sent to pt's e mail  Suprep $15 coupon to pt in [place]

## 2019-04-30 ENCOUNTER — Encounter: Payer: Self-pay | Admitting: Gastroenterology

## 2019-05-12 ENCOUNTER — Telehealth: Payer: Self-pay

## 2019-05-12 NOTE — Telephone Encounter (Signed)
Covid-19 screening questions   Do you now or have you had a fever in the last 14 days?  Do you have any respiratory symptoms of shortness of breath or cough now or in the last 14 days?  Do you have any family members or close contacts with diagnosed or suspected Covid-19 in the past 14 days?  Have you been tested for Covid-19 and found to be positive?       

## 2019-05-12 NOTE — Telephone Encounter (Signed)
Pt responded "no" to all screening questions °

## 2019-05-13 ENCOUNTER — Other Ambulatory Visit: Payer: Self-pay

## 2019-05-13 ENCOUNTER — Other Ambulatory Visit: Payer: Self-pay | Admitting: Gastroenterology

## 2019-05-13 ENCOUNTER — Ambulatory Visit (AMBULATORY_SURGERY_CENTER): Payer: 59 | Admitting: Gastroenterology

## 2019-05-13 ENCOUNTER — Encounter: Payer: Self-pay | Admitting: Gastroenterology

## 2019-05-13 VITALS — BP 113/77 | HR 57 | Temp 98.3°F | Resp 14 | Ht 65.0 in | Wt 196.0 lb

## 2019-05-13 DIAGNOSIS — K635 Polyp of colon: Secondary | ICD-10-CM | POA: Diagnosis not present

## 2019-05-13 DIAGNOSIS — Z1211 Encounter for screening for malignant neoplasm of colon: Secondary | ICD-10-CM

## 2019-05-13 MED ORDER — SODIUM CHLORIDE 0.9 % IV SOLN: 500.0000 mL | Freq: Once | INTRAVENOUS | Status: DC

## 2019-05-13 NOTE — Progress Notes (Signed)
Called to room to assist during endoscopic procedure.  Patient ID and intended procedure confirmed with present staff. Received instructions for my participation in the procedure from the performing physician.  

## 2019-05-13 NOTE — Patient Instructions (Signed)
Information on polyps and hemorrhoids given to you today.  Await pathology results.  YOU HAD AN ENDOSCOPIC PROCEDURE TODAY AT THE [facility]:   Refer to the procedure report that was given to you for any specific questions about what was found during the examination.  If the procedure report does not answer your questions, please call your gastroenterologist to clarify.  If you requested that your care partner not be given the details of your procedure findings, then the procedure report has been included in a sealed envelope for you to review at your convenience later.  YOU SHOULD EXPECT: Some feelings of bloating in the abdomen. Passage of more gas than usual.  Walking can help get rid of the air that was put into your GI tract during the procedure and reduce the bloating. If you had a lower endoscopy (such as a colonoscopy or flexible sigmoidoscopy) you may notice spotting of blood in your stool or on the toilet paper. If you underwent a bowel prep for your procedure, you may not have a normal bowel movement for a few days.  Please Note:  You might notice some irritation and congestion in your nose or some drainage.  This is from the oxygen used during your procedure.  There is no need for concern and it should clear up in a day or so.  SYMPTOMS TO REPORT IMMEDIATELY:   Following lower endoscopy (colonoscopy or flexible sigmoidoscopy):  Excessive amounts of blood in the stool  Significant tenderness or worsening of abdominal pains  Swelling of the abdomen that is new, acute  Fever of 100F or higher   For urgent or emergent issues, a gastroenterologist can be reached at any hour by calling [phone].   DIET:  We do recommend a small meal at first, but then you may proceed to your regular diet.  Drink plenty of fluids but you should avoid alcoholic beverages for 24 hours.  ACTIVITY:  You should plan to take it easy for the rest of today and you should NOT DRIVE or use  heavy machinery until tomorrow (because of the sedation medicines used during the test).    FOLLOW UP: Our staff will call the number listed on your records 48-72 hours following your procedure to check on you and address any questions or concerns that you may have regarding the information given to you following your procedure. If we do not reach you, we will leave a message.  We will attempt to reach you two times.  During this call, we will ask if you have developed any symptoms of COVID 19. If you develop any symptoms (ie: fever, flu-like symptoms, shortness of breath, cough etc.) before then, please call [phone].  If you test positive for Covid 19 in the 2 weeks post procedure, please call and report this information to us.    If any biopsies were taken you will be contacted by phone or by letter within the next 1-3 weeks.  Please call us at [phone] if you have not heard about the biopsies in 3 weeks.    SIGNATURES/CONFIDENTIALITY: You and/or your care partner have signed paperwork which will be entered into your electronic medical record.  These signatures attest to the fact that that the information above on your After Visit Summary has been reviewed and is understood.  Full responsibility of the confidentiality of this discharge information lies with you and/or your care-partner. 

## 2019-05-13 NOTE — Progress Notes (Signed)
PT taken to PACU. Monitors in place. VSS. Report given to RN. 

## 2019-05-13 NOTE — Progress Notes (Signed)
/  Temp check by JB/vital check by CW.  No changes in medical or surgical history since prev-visit screening on 04/29/19.

## 2019-05-13 NOTE — Op Note (Signed)
[facility] Patient Name: Lea Asa Procedure Date: 05/13/2019 9:08 AM MRN: [id number] Endoscopist: Mauri Pole , MD Age: 48 Referring MD:  Date of Birth: [birth year] Gender: Male Account #: [id number] Procedure:                Colonoscopy Indications:              Screening for colorectal malignant neoplasm Medicines:                Monitored Anesthesia Care Procedure:                Pre-Anesthesia Assessment:                           - Prior to the procedure, a History and Physical                            was performed, and patient medications and                            allergies were reviewed. The patient's tolerance of                            previous anesthesia was also reviewed. The risks                            and benefits of the procedure and the sedation                            options and risks were discussed with the patient.                            All questions were answered, and informed consent                            was obtained. Prior Anticoagulants: The patient has                            taken no previous anticoagulant or antiplatelet                            agents. ASA Grade Assessment: II - A patient with                            mild systemic disease. After reviewing the risks                            and benefits, the patient was deemed in                            satisfactory condition to undergo the procedure.                           After obtaining informed consent, the colonoscope  was passed under direct vision. Throughout the                            procedure, the patient's blood pressure, pulse, and                            oxygen saturations were monitored continuously. The                            Colonoscope was introduced through the anus and                            advanced to the the cecum, identified by                            appendiceal orifice and  ileocecal valve. The                            colonoscopy was performed without difficulty. The                            patient tolerated the procedure well. The quality                            of the bowel preparation was excellent. The                            ileocecal valve, appendiceal orifice, and rectum                            were photographed. Scope In: 9:13:21 AM Scope Out: 9:25:45 AM Scope Withdrawal Time: 0 hours 8 minutes 58 seconds  Total Procedure Duration: 0 hours 12 minutes 24 seconds  Findings:                 The perianal and digital rectal examinations were                            normal.                           A 5 mm polyp was found in the sigmoid colon. The                            polyp was sessile. The polyp was removed with a                            cold snare. Resection and retrieval were complete.                           Non-bleeding internal hemorrhoids were found during                            retroflexion. The hemorrhoids were small.  The exam was otherwise without abnormality. Complications:            No immediate complications. Estimated Blood Loss:     Estimated blood loss was minimal. Impression:               - One 5 mm polyp in the sigmoid colon, removed with                            a cold snare. Resected and retrieved.                           - Non-bleeding internal hemorrhoids.                           - The examination was otherwise normal. Recommendation:           - Patient has a contact number available for                            emergencies. The signs and symptoms of potential                            delayed complications were discussed with the                            patient. Return to normal activities tomorrow.                            Written discharge instructions were provided to the                            patient.                           - Resume previous  diet.                           - Continue present medications.                           - Await pathology results.                           - Repeat colonoscopy in 5-10 years for surveillance                            based on pathology results. Mauri Pole, MD 05/13/2019 9:31:07 AM This report has been signed electronically.

## 2019-05-17 ENCOUNTER — Telehealth: Payer: Self-pay | Admitting: *Deleted

## 2019-05-17 ENCOUNTER — Telehealth: Payer: Self-pay

## 2019-05-17 NOTE — Telephone Encounter (Signed)
No answer, message left for the patient. 

## 2019-05-17 NOTE — Telephone Encounter (Signed)
  Follow up Call-  Call back number 05/13/2019  Post procedure Call Back phone  # [phone]  Permission to leave phone message Yes  Some recent data might be hidden     Patient questions:  Do you have a fever, pain , or abdominal swelling? No. Pain Score  0 *  Have you tolerated food without any problems? Yes.    Have you been able to return to your normal activities? Yes.    Do you have any questions about your discharge instructions: Diet   No. Medications  No. Follow up visit  No.  Do you have questions or concerns about your Care? No.  Actions: * If pain score is 4 or above: No action needed, pain <4.  1. Have you developed a fever since your procedure? no  2.   Have you had an respiratory symptoms (SOB or cough) since your procedure? no  3.   Have you tested positive for COVID 19 since your procedure no  4.   Have you had any family members/close contacts diagnosed with the COVID 19 since your procedure?  no   If yes to any of these questions please route to Joylene Ashten, RN and Alphonsa Gin, Therapist, sports.

## 2019-05-21 ENCOUNTER — Encounter: Payer: Self-pay | Admitting: Gastroenterology

## 2019-06-03 ENCOUNTER — Other Ambulatory Visit: Payer: Self-pay | Admitting: Family Medicine

## 2019-06-03 ENCOUNTER — Other Ambulatory Visit: Payer: Self-pay

## 2019-06-03 ENCOUNTER — Other Ambulatory Visit (INDEPENDENT_AMBULATORY_CARE_PROVIDER_SITE_OTHER): Payer: 59

## 2019-06-03 DIAGNOSIS — E119 Type 2 diabetes mellitus without complications: Secondary | ICD-10-CM

## 2019-06-03 LAB — CBC WITH DIFFERENTIAL/PLATELET
Basophils Absolute: 0.1 10*3/uL (ref 0.0–0.1)
Basophils Relative: 0.8 % (ref 0.0–3.0)
Eosinophils Absolute: 0.6 10*3/uL (ref 0.0–0.7)
Eosinophils Relative: 6.3 % — ABNORMAL HIGH (ref 0.0–5.0)
HCT: 45.7 % (ref 39.0–52.0)
Hemoglobin: 15.6 g/dL (ref 13.0–17.0)
Lymphocytes Relative: 43 % (ref 12.0–46.0)
Lymphs Abs: 3.9 10*3/uL (ref 0.7–4.0)
MCHC: 34.2 g/dL (ref 30.0–36.0)
MCV: 89.7 fl (ref 78.0–100.0)
Monocytes Absolute: 0.5 10*3/uL (ref 0.1–1.0)
Monocytes Relative: 6 % (ref 3.0–12.0)
Neutro Abs: 4 10*3/uL (ref 1.4–7.7)
Neutrophils Relative %: 43.9 % (ref 43.0–77.0)
Platelets: 297 10*3/uL (ref 150.0–400.0)
RBC: 5.1 Mil/uL (ref 4.22–5.81)
RDW: 12.9 % (ref 11.5–15.5)
WBC: 9 10*3/uL (ref 4.0–10.5)

## 2019-06-03 LAB — COMPREHENSIVE METABOLIC PANEL
ALT: 77 U/L — ABNORMAL HIGH (ref 0–53)
AST: 41 U/L — ABNORMAL HIGH (ref 0–37)
Albumin: 4.4 g/dL (ref 3.5–5.2)
Alkaline Phosphatase: 62 U/L (ref 39–117)
BUN: 15 mg/dL (ref 6–23)
CO2: 26 mEq/L (ref 19–32)
Calcium: 9.6 mg/dL (ref 8.4–10.5)
Chloride: 102 mEq/L (ref 96–112)
Creatinine, Ser: 0.86 mg/dL (ref 0.40–1.50)
GFR: 93.86 mL/min (ref >60.00)
Glucose, Bld: 87 mg/dL (ref 70–99)
Potassium: 4.7 mEq/L (ref 3.5–5.1)
Sodium: 137 mEq/L (ref 135–145)
Total Bilirubin: 0.6 mg/dL (ref 0.2–1.2)
Total Protein: 6.8 g/dL (ref 6.0–8.3)

## 2019-06-03 LAB — HEMOGLOBIN A1C: Hgb A1c MFr Bld: 6.9 % — ABNORMAL HIGH (ref 4.6–6.5)

## 2019-06-03 LAB — LIPID PANEL
Cholesterol: 161 mg/dL (ref 0–200)
HDL: 41.7 mg/dL (ref >39.00)
LDL Cholesterol: 100 mg/dL — ABNORMAL HIGH (ref 0–99)
NonHDL: 119.62
Total CHOL/HDL Ratio: 4
Triglycerides: 97 mg/dL (ref 0.0–149.0)
VLDL: 19.4 mg/dL (ref 0.0–40.0)

## 2019-06-03 LAB — FERRITIN: Ferritin: 44.6 ng/mL (ref 22.0–322.0)

## 2019-06-10 ENCOUNTER — Ambulatory Visit: Payer: 59 | Admitting: Family Medicine

## 2019-06-10 ENCOUNTER — Other Ambulatory Visit: Payer: Self-pay

## 2019-06-10 ENCOUNTER — Encounter: Payer: Self-pay | Admitting: Family Medicine

## 2019-06-10 VITALS — BP 150/96 | HR 77 | Temp 97.6°F | Ht 65.0 in | Wt 197.0 lb

## 2019-06-10 DIAGNOSIS — Z23 Encounter for immunization: Secondary | ICD-10-CM | POA: Diagnosis not present

## 2019-06-10 DIAGNOSIS — E119 Type 2 diabetes mellitus without complications: Secondary | ICD-10-CM | POA: Diagnosis not present

## 2019-06-10 NOTE — Patient Instructions (Signed)
Recheck in about 4 months with labs ahead of time.  Thanks for getting a flu shot.  I thank you for your effort.  You deserve a lot of credit.  I want school to go well for your kids.  Take care.  Glad to see you.

## 2019-06-10 NOTE — Progress Notes (Signed)
Diabetes:  Using medications without difficulties: yes Hypoglycemic episodes:no Hyperglycemic episodes:no Feet problems:no Blood Sugars averaging: usually ~100-130s on home checks. Labs d/w pt.  A1c stable.    50 year old daughter still at home with school.  His son recently started back with kindergarten.    He and his wife have been covered up trying to manage work and school.  And his wife just had a hysterectomy last week.  He is trying to manage all the changes and given all of his work and home demands he is doing well, and I thanked him for his effort.  LFTs stable.  Ferritin similar.  A1c controlled.  D/w pt.  Given his situation, his labs are reasonable, d/w pt.    Meds, vitals, and allergies reviewed.   ROS: Per HPI unless specifically indicated in ROS section   GEN: nad, alert and oriented HEENT: ncat NECK: supple w/o LA CV: rrr. PULM: ctab, no inc wob ABD: soft, +bs EXT: no edema SKIN: no acute rash  Diabetic foot exam: Normal inspection No skin breakdown No calluses  Normal DP pulses Normal sensation to light touch and monofilament Nails normal

## 2019-06-13 NOTE — Assessment & Plan Note (Signed)
A1c stable.   Continue work on diet and exercise.  Recheck periodically.  He agrees.

## 2019-06-13 NOTE — Assessment & Plan Note (Signed)
LFTs stable.  Ferritin similar.  A1c controlled.  D/w pt.  Given his situation, his labs are reasonable, d/w pt.

## 2019-06-19 ENCOUNTER — Other Ambulatory Visit: Payer: Self-pay | Admitting: Family Medicine

## 2019-10-20 ENCOUNTER — Other Ambulatory Visit: Payer: Self-pay | Admitting: Family Medicine

## 2019-10-20 DIAGNOSIS — E119 Type 2 diabetes mellitus without complications: Secondary | ICD-10-CM

## 2019-10-21 ENCOUNTER — Other Ambulatory Visit (INDEPENDENT_AMBULATORY_CARE_PROVIDER_SITE_OTHER): Payer: 59

## 2019-10-21 DIAGNOSIS — E119 Type 2 diabetes mellitus without complications: Secondary | ICD-10-CM | POA: Diagnosis not present

## 2019-10-21 LAB — FERRITIN: Ferritin: 35.5 ng/mL (ref 22.0–322.0)

## 2019-10-21 LAB — COMPREHENSIVE METABOLIC PANEL
ALT: 70 U/L — ABNORMAL HIGH (ref 0–53)
AST: 37 U/L (ref 0–37)
Albumin: 4.3 g/dL (ref 3.5–5.2)
Alkaline Phosphatase: 67 U/L (ref 39–117)
BUN: 15 mg/dL (ref 6–23)
CO2: 27 mEq/L (ref 19–32)
Calcium: 9.8 mg/dL (ref 8.4–10.5)
Chloride: 104 mEq/L (ref 96–112)
Creatinine, Ser: 0.92 mg/dL (ref 0.40–1.50)
GFR: 86.7 mL/min (ref >60.00)
Glucose, Bld: 93 mg/dL (ref 70–99)
Potassium: 4.4 mEq/L (ref 3.5–5.1)
Sodium: 140 mEq/L (ref 135–145)
Total Bilirubin: 0.4 mg/dL (ref 0.2–1.2)
Total Protein: 7.1 g/dL (ref 6.0–8.3)

## 2019-10-21 LAB — CBC WITH DIFFERENTIAL/PLATELET
Basophils Absolute: 0.1 10*3/uL (ref 0.0–0.1)
Basophils Relative: 1.1 % (ref 0.0–3.0)
Eosinophils Absolute: 0.3 10*3/uL (ref 0.0–0.7)
Eosinophils Relative: 3.7 % (ref 0.0–5.0)
HCT: 44 % (ref 39.0–52.0)
Hemoglobin: 15.1 g/dL (ref 13.0–17.0)
Lymphocytes Relative: 39.8 % (ref 12.0–46.0)
Lymphs Abs: 3.5 10*3/uL (ref 0.7–4.0)
MCHC: 34.3 g/dL (ref 30.0–36.0)
MCV: 89.6 fl (ref 78.0–100.0)
Monocytes Absolute: 0.7 10*3/uL (ref 0.1–1.0)
Monocytes Relative: 7.6 % (ref 3.0–12.0)
Neutro Abs: 4.2 10*3/uL (ref 1.4–7.7)
Neutrophils Relative %: 47.8 % (ref 43.0–77.0)
Platelets: 277 10*3/uL (ref 150.0–400.0)
RBC: 4.91 Mil/uL (ref 4.22–5.81)
RDW: 13 % (ref 11.5–15.5)
WBC: 8.7 10*3/uL (ref 4.0–10.5)

## 2019-10-21 LAB — LIPID PANEL
Cholesterol: 150 mg/dL (ref 0–200)
HDL: 33.8 mg/dL — ABNORMAL LOW (ref >39.00)
LDL Cholesterol: 82 mg/dL (ref 0–99)
NonHDL: 116.03
Total CHOL/HDL Ratio: 4
Triglycerides: 171 mg/dL — ABNORMAL HIGH (ref 0.0–149.0)
VLDL: 34.2 mg/dL (ref 0.0–40.0)

## 2019-10-21 NOTE — Addendum Note (Signed)
Addended by: Cloyd Stagers on: 10/21/2019 08:09 AM   Modules accepted: Orders

## 2019-10-22 LAB — HEMOGLOBIN A1C
Hgb A1c MFr Bld: 6.7 % of total Hgb — ABNORMAL HIGH (ref <5.7)
Mean Plasma Glucose: 146 (calc)
eAG (mmol/L): 8.1 (calc)

## 2019-10-25 ENCOUNTER — Other Ambulatory Visit: Payer: Self-pay

## 2019-10-25 ENCOUNTER — Ambulatory Visit: Payer: 59 | Admitting: Family Medicine

## 2019-10-25 ENCOUNTER — Encounter: Payer: Self-pay | Admitting: Family Medicine

## 2019-10-25 VITALS — BP 146/96 | HR 82 | Temp 97.2°F | Ht 65.0 in | Wt 203.4 lb

## 2019-10-25 DIAGNOSIS — I10 Essential (primary) hypertension: Secondary | ICD-10-CM

## 2019-10-25 DIAGNOSIS — E7800 Pure hypercholesterolemia, unspecified: Secondary | ICD-10-CM | POA: Diagnosis not present

## 2019-10-25 DIAGNOSIS — E119 Type 2 diabetes mellitus without complications: Secondary | ICD-10-CM | POA: Diagnosis not present

## 2019-10-25 NOTE — Progress Notes (Signed)
This visit occurred during the SARS-CoV-2 public health emergency.  Safety protocols were in place, including screening questions prior to the visit, additional usage of staff PPE, and extensive cleaning of exam room while observing appropriate contact time as indicated for disinfecting solutions.  Diabetes:  Using medications without difficulties: yes Hypoglycemic episodes:no Hyperglycemic episodes:no Feet problems: not except for mild fungal changes between the toes Blood Sugars averaging: usually ~130s eye exam within last year: f/u pending.   A1c 6.7.   Hypertension:  Using medication without problems or lightheadedness:  Yes except rarely lightheaded-brief.  No syncope.   Chest pain with exertion:no Edema:no Short of breath:no Taking 5mg  lisinopril and 100mg  metoprolol a day.    Elevated Cholesterol: Using medications without problems: yes Muscle aches: no Diet compliance: yes Exercise: encouraged.  Active at work.   Labs d/w pt. LDL improved, down to 82.    Hemochromatosis.  ALT 70, ferritin 35.  Both lower than prev.  He didn't give blood.   Meds, vitals, and allergies reviewed.   ROS: Per HPI unless specifically indicated in ROS section   GEN: nad, alert and oriented HEENT: ncat NECK: supple w/o LA CV: rrr. PULM: ctab, no inc wob ABD: soft, +bs EXT: no edema SKIN: Well-perfused.

## 2019-10-25 NOTE — Patient Instructions (Signed)
Don't change your meds for now and recheck labs prior to a physical in about 3-4 months.  Thanks for your effort.  Update me as needed.  Take care.  Glad to see you.

## 2019-10-27 NOTE — Assessment & Plan Note (Signed)
Labs d/w pt. LDL improved, down to 82.   Continue work on diet and exercise.  No change in meds.  He agrees.

## 2019-10-27 NOTE — Assessment & Plan Note (Signed)
eye exam within last year: f/u pending.   A1c 6.7.  No change in meds at this point.  Continue work on diet and exercise.  He agrees.

## 2019-10-27 NOTE — Assessment & Plan Note (Signed)
No change in meds.  I do not want to induce hypotension.  Continue work on diet and exercise. Taking 5mg  lisinopril and 100mg  metoprolol a day.

## 2019-10-27 NOTE — Assessment & Plan Note (Signed)
ALT and ferritin both improved.  Discussed with patient.  He did this without giving blood so this is notable improvement.  Continue as is.  Recheck periodically.  He agrees.

## 2019-11-02 ENCOUNTER — Other Ambulatory Visit: Payer: Self-pay | Admitting: *Deleted

## 2019-11-02 MED ORDER — METOPROLOL SUCCINATE ER 50 MG PO TB24: ORAL_TABLET | ORAL | 1 refills | Status: DC

## 2019-11-22 ENCOUNTER — Other Ambulatory Visit: Payer: Self-pay

## 2019-11-22 ENCOUNTER — Encounter: Payer: Self-pay | Admitting: Family Medicine

## 2019-11-22 ENCOUNTER — Ambulatory Visit: Payer: 59 | Admitting: Family Medicine

## 2019-11-22 VITALS — BP 168/90 | HR 79 | Temp 98.8°F | Ht 65.0 in | Wt 204.8 lb

## 2019-11-22 DIAGNOSIS — H6121 Impacted cerumen, right ear: Secondary | ICD-10-CM | POA: Diagnosis not present

## 2019-11-22 DIAGNOSIS — H9201 Otalgia, right ear: Secondary | ICD-10-CM | POA: Diagnosis not present

## 2019-11-22 DIAGNOSIS — H60311 Diffuse otitis externa, right ear: Secondary | ICD-10-CM

## 2019-11-22 MED ORDER — NEOMYCIN-POLYMYXIN-HC 3.5-10000-1 OT SUSP: 3.0000 [drp] | Freq: Four times a day (QID) | OTIC | 0 refills | Status: DC

## 2019-11-22 NOTE — Progress Notes (Signed)
Chief Complaint  Patient presents with  . Ear Pain    Right     History of Present Illness: HPI   49 year old male pt pf Dr. Josefine Class with history of DM and HTN with new onset pain in right ear x1 days. Started 2 days ago..less hearing in last few days.  Feels like there is a plug in right ear. Tried to treat with warm water.   he wears foam ear plugs at work.  Now pain in ear canal.  No sneeze, mild congestion form allergies but none today, no fever, no SOB.    BP elevation in office today: ON lisinopril and metoprolol  This visit occurred during the SARS-CoV-2 public health emergency.  Safety protocols were in place, including screening questions prior to the visit, additional usage of staff PPE, and extensive cleaning of exam room while observing appropriate contact time as indicated for disinfecting solutions.   COVID 19 screen:  No recent travel or known exposure to COVID19 The patient denies respiratory symptoms of COVID 19 at this time. The importance of social distancing was discussed today.     Review of Systems  Constitutional: Negative for chills and fever.  HENT: Negative for congestion and ear pain.   Eyes: Negative for pain and redness.  Respiratory: Negative for cough and shortness of breath.   Cardiovascular: Negative for chest pain, palpitations and leg swelling.  Gastrointestinal: Negative for abdominal pain, blood in stool, constipation, diarrhea, nausea and vomiting.  Genitourinary: Negative for dysuria.  Musculoskeletal: Negative for falls and myalgias.  Skin: Negative for rash.  Neurological: Negative for dizziness.  Psychiatric/Behavioral: Negative for depression. The patient is not nervous/anxious.       Past Medical History:  Diagnosis Date  . Allergy    fall  . Anxiety   . Diabetes mellitus, type II ([place]) 2011  . Fatty liver   . GERD (gastroesophageal reflux disease)   . Hemochromatosis    per GI- ferritin and LFT's every 6 months per  Dr. Sharlett Iles  . Hiatal hernia   . HLD (hyperlipidemia)   . HTN (hypertension) 03/2005  . Schatzki's ring 02/2010   s/p dilation per Dr. Sharlett Iles    reports that he has never smoked. He has never used smokeless tobacco. He reports current alcohol use. He reports that he does not use drugs.   Current Outpatient Medications:  .  glimepiride (AMARYL) 1 MG tablet, Take 1 tab daily prior to going to work, Disp: 90 tablet, Rfl: 3 .  lansoprazole (PREVACID) 15 MG capsule, Take 1 capsule (15 mg total) by mouth daily., Disp: , Rfl:  .  lisinopril (ZESTRIL) 5 MG tablet, TAKE 1 TO 2 TABLETS BY MOUTH DAILY, Disp: 180 tablet, Rfl: 1 .  metFORMIN (GLUCOPHAGE) 500 MG tablet, TAKE 2 TABLETS BY MOUTH 2  TIMES DAILY WITH MEALS, Disp: 360 tablet, Rfl: 3 .  metoprolol succinate (TOPROL-XL) 50 MG 24 hr tablet, TAKE 2 TABLETS BY MOUTH&nbsp;&nbsp;DAILY. TAKE WITH OR&nbsp;&nbsp;IMMEDIATELY FOLLOWING A&nbsp;&nbsp;MEAL., Disp: 180 tablet, Rfl: 1 .  pravastatin (PRAVACHOL) 20 MG tablet, TAKE 1 TABLET BY MOUTH EVERY OTHER DAY GENERIC EQUIVALENT FOR PRAVACHOL, Disp: 45 tablet, Rfl: 3   Observations/Objective: Blood pressure (!) 168/90, pulse 79, temperature 98.8 F (37.1 C), temperature source Temporal, height 5\' 5"  (1.651 m), weight 204 lb 12 oz (92.9 kg), SpO2 96 %.   Physical Exam Constitutional:      Appearance: He is well-developed.  HENT:     Head: Normocephalic.  Right Ear: Hearing normal. Swelling and tenderness present. There is impacted cerumen.     Left Ear: Hearing, tympanic membrane, ear canal and external ear normal.     Ears:     Comments: Redness in right ear canal    Nose: Nose normal.  Neck:     Thyroid: No thyroid mass or thyromegaly.     Vascular: No carotid bruit.     Trachea: Trachea normal.  Cardiovascular:     Rate and Rhythm: Normal rate and regular rhythm.     Pulses: Normal pulses.     Heart sounds: Heart sounds not distant. No murmur. No friction rub. No gallop.       Comments: No peripheral edema Pulmonary:     Effort: Pulmonary effort is normal. No respiratory distress.     Breath sounds: Normal breath sounds.  Skin:    General: Skin is warm and dry.     Findings: No rash.  Psychiatric:        Speech: Speech normal.        Behavior: Behavior normal.        Thought Content: Thought content normal.      Assessment and Plan      Eliezer Lofts, MD

## 2019-11-22 NOTE — Patient Instructions (Addendum)
Treat with topical antibiotic eardrops as directed. If ear wax blockage remains when course compelte.. follow with Debrox or Cerumenex daily to soften wax. Can also use over the counter irrigation and suction at that time.

## 2020-01-08 ENCOUNTER — Other Ambulatory Visit: Payer: Self-pay | Admitting: Family Medicine

## 2020-02-16 ENCOUNTER — Encounter: Payer: Self-pay | Admitting: Family Medicine

## 2020-02-17 ENCOUNTER — Other Ambulatory Visit: Payer: Self-pay | Admitting: *Deleted

## 2020-02-17 MED ORDER — LISINOPRIL 5 MG PO TABS: 5.0000 mg | ORAL_TABLET | Freq: Every day | ORAL | 1 refills | Status: DC

## 2020-02-17 MED ORDER — METFORMIN HCL 500 MG PO TABS: ORAL_TABLET | ORAL | 1 refills | Status: DC

## 2020-02-17 MED ORDER — METOPROLOL SUCCINATE ER 50 MG PO TB24: ORAL_TABLET | ORAL | 1 refills | Status: DC

## 2020-02-17 MED ORDER — PRAVASTATIN SODIUM 20 MG PO TABS: ORAL_TABLET | ORAL | 1 refills | Status: DC

## 2020-02-17 MED ORDER — LANSOPRAZOLE 15 MG PO CPDR: 15.0000 mg | DELAYED_RELEASE_CAPSULE | Freq: Every day | ORAL | 1 refills | Status: DC

## 2020-02-17 MED ORDER — GLIMEPIRIDE 1 MG PO TABS: ORAL_TABLET | ORAL | 1 refills | Status: DC

## 2020-02-21 ENCOUNTER — Telehealth: Payer: Self-pay | Admitting: Family Medicine

## 2020-02-21 NOTE — Telephone Encounter (Signed)
Kristin with [facility] regarding Atorvastatin 20mg   They wanted to let our office know they received the medication refill and wanted to make sure the patient has tolerated with medication previously before they fill it. They noticed it is listed on his allergy list and wanted to make sure he has tolerated the 20mg  dose.   Erasmo Downer requests a call back so that she can process the Rx

## 2020-02-22 NOTE — Telephone Encounter (Signed)
Please verify that this was for pravastatin 20 mg, not atorvastatin.  He had aches at 40 mg of pravastatin but was going to try 20 mg dosing.  Thanks.

## 2020-02-22 NOTE — Telephone Encounter (Signed)
Phoned [employer] [facility] and verified that the medication in question is Pravastatin.  An allergy was noted to this medication (muscle aches) which prompted the call but patient is trying the medication at a lower dose per Dr. Josefine Class instructions.  Pharmacy advised.

## 2020-02-28 ENCOUNTER — Other Ambulatory Visit: Payer: Self-pay | Admitting: Family Medicine

## 2020-02-28 DIAGNOSIS — Z125 Encounter for screening for malignant neoplasm of prostate: Secondary | ICD-10-CM

## 2020-02-28 DIAGNOSIS — E119 Type 2 diabetes mellitus without complications: Secondary | ICD-10-CM

## 2020-03-06 ENCOUNTER — Other Ambulatory Visit (INDEPENDENT_AMBULATORY_CARE_PROVIDER_SITE_OTHER): Payer: BC Managed Care – PPO

## 2020-03-06 ENCOUNTER — Other Ambulatory Visit: Payer: Self-pay

## 2020-03-06 DIAGNOSIS — E119 Type 2 diabetes mellitus without complications: Secondary | ICD-10-CM

## 2020-03-06 DIAGNOSIS — Z125 Encounter for screening for malignant neoplasm of prostate: Secondary | ICD-10-CM | POA: Diagnosis not present

## 2020-03-06 LAB — COMPREHENSIVE METABOLIC PANEL
ALT: 107 U/L — ABNORMAL HIGH (ref 0–53)
AST: 61 U/L — ABNORMAL HIGH (ref 0–37)
Albumin: 4.5 g/dL (ref 3.5–5.2)
Alkaline Phosphatase: 71 U/L (ref 39–117)
BUN: 17 mg/dL (ref 6–23)
CO2: 28 mEq/L (ref 19–32)
Calcium: 9.7 mg/dL (ref 8.4–10.5)
Chloride: 102 mEq/L (ref 96–112)
Creatinine, Ser: 0.97 mg/dL (ref 0.40–1.50)
GFR: 81.44 mL/min (ref >60.00)
Glucose, Bld: 122 mg/dL — ABNORMAL HIGH (ref 70–99)
Potassium: 4.7 mEq/L (ref 3.5–5.1)
Sodium: 139 mEq/L (ref 135–145)
Total Bilirubin: 0.7 mg/dL (ref 0.2–1.2)
Total Protein: 6.7 g/dL (ref 6.0–8.3)

## 2020-03-06 LAB — CBC WITH DIFFERENTIAL/PLATELET
Basophils Absolute: 0 10*3/uL (ref 0.0–0.1)
Basophils Relative: 0.5 % (ref 0.0–3.0)
Eosinophils Absolute: 0.3 10*3/uL (ref 0.0–0.7)
Eosinophils Relative: 3.8 % (ref 0.0–5.0)
HCT: 44.1 % (ref 39.0–52.0)
Hemoglobin: 14.9 g/dL (ref 13.0–17.0)
Lymphocytes Relative: 32.5 % (ref 12.0–46.0)
Lymphs Abs: 2.9 10*3/uL (ref 0.7–4.0)
MCHC: 33.8 g/dL (ref 30.0–36.0)
MCV: 90.5 fl (ref 78.0–100.0)
Monocytes Absolute: 0.7 10*3/uL (ref 0.1–1.0)
Monocytes Relative: 8.1 % (ref 3.0–12.0)
Neutro Abs: 4.9 10*3/uL (ref 1.4–7.7)
Neutrophils Relative %: 55.1 % (ref 43.0–77.0)
Platelets: 258 10*3/uL (ref 150.0–400.0)
RBC: 4.87 Mil/uL (ref 4.22–5.81)
RDW: 13.3 % (ref 11.5–15.5)
WBC: 8.9 10*3/uL (ref 4.0–10.5)

## 2020-03-06 LAB — FERRITIN: Ferritin: 59.8 ng/mL (ref 22.0–322.0)

## 2020-03-06 LAB — LIPID PANEL
Cholesterol: 192 mg/dL (ref 0–200)
HDL: 41.9 mg/dL (ref >39.00)
LDL Cholesterol: 123 mg/dL — ABNORMAL HIGH (ref 0–99)
NonHDL: 150.26
Total CHOL/HDL Ratio: 5
Triglycerides: 135 mg/dL (ref 0.0–149.0)
VLDL: 27 mg/dL (ref 0.0–40.0)

## 2020-03-06 LAB — HEMOGLOBIN A1C: Hgb A1c MFr Bld: 7.3 % — ABNORMAL HIGH (ref 4.6–6.5)

## 2020-03-06 LAB — PSA: PSA: 0.67 ng/mL (ref 0.10–4.00)

## 2020-03-13 ENCOUNTER — Encounter: Payer: Self-pay | Admitting: Family Medicine

## 2020-03-13 ENCOUNTER — Ambulatory Visit (INDEPENDENT_AMBULATORY_CARE_PROVIDER_SITE_OTHER): Payer: BC Managed Care – PPO | Admitting: Family Medicine

## 2020-03-13 ENCOUNTER — Other Ambulatory Visit: Payer: Self-pay

## 2020-03-13 VITALS — BP 144/108 | HR 73 | Temp 97.7°F | Ht 65.0 in | Wt 202.3 lb

## 2020-03-13 DIAGNOSIS — Z7189 Other specified counseling: Secondary | ICD-10-CM

## 2020-03-13 DIAGNOSIS — Z0000 Encounter for general adult medical examination without abnormal findings: Secondary | ICD-10-CM | POA: Diagnosis not present

## 2020-03-13 DIAGNOSIS — R1319 Other dysphagia: Secondary | ICD-10-CM

## 2020-03-13 DIAGNOSIS — E119 Type 2 diabetes mellitus without complications: Secondary | ICD-10-CM

## 2020-03-13 DIAGNOSIS — E7800 Pure hypercholesterolemia, unspecified: Secondary | ICD-10-CM

## 2020-03-13 NOTE — Patient Instructions (Addendum)
No change in meds at this point.  Keep working on diet and exercise.   Recheck in about 4 months, with labs ahead of time.  You don't have to fast.  Take care.  Glad to see you. Keep rinsing your ear with warm water in the shower.

## 2020-03-13 NOTE — Progress Notes (Signed)
CPE- See plan.  Routine anticipatory guidance given to patient.  See health maintenance.  The possibility exists that previously documented standard health maintenance information may have been brought forward from a previous encounter into this note.  If needed, that same information has been updated to reflect the current situation based on today's encounter.    Tetanus 2016 Flu shot 2020 PNA 2014 Shingles d/w pt.   covid vaccine prev done.  D/w pt.   Colonoscopy 2020 Prostate cancer screening and PSA options (with potential risks and benefits of testing vs not testing) were discussed along with recent recs/guidelines.  FH noted, reasonable to check.  Diet and exercise d/w pt. Complicated by working nights.  Encouraged exercise, done mainly at work, he is walking at work. Living will d/w pt. Would have his wife designated if patient were incapacitated.   Diabetes:  Using medications without difficulties: yes, see below.   Hypoglycemic episodes:no sx Hyperglycemic episodes:no sx Feet problems: no Blood Sugars averaging: not checked often.   eye exam within last year: due, d/w pt.   He had missed a few metformin in the AM.   Labs d/w pt.    Hemochromatosis.  Ferritin reasonable, LFTs slightly elevated.  No abd pain.  No FCVD.    Elevated Cholesterol: Using medications without problems: yes Muscle aches: no Diet compliance: encouraged.   Exercise: encouraged  GERD.  H/o esophageal ring.  Swallowing well.  Still on PPI.    H/o cerumen in the canals.  PMH and SH reviewed  Meds, vitals, and allergies reviewed.   ROS: Per HPI.  Unless specifically indicated otherwise in HPI, the patient denies:  General: fever. Eyes: acute vision changes ENT: sore throat Cardiovascular: chest pain Respiratory: SOB GI: vomiting GU: dysuria Musculoskeletal: acute back pain Derm: acute rash Neuro: acute motor dysfunction Psych: worsening mood Endocrine: polydipsia Heme:  bleeding Allergy: hayfever  GEN: nad, alert and oriented HEENT: canals w/o irritation but R canal with cerumen removed with curette.  Small amount of residual wax remains.  He can rinse that out at home/irrigate in the shower.  Discussed. NECK: supple w/o LA CV: rrr. PULM: ctab, no inc wob ABD: soft, +bs EXT: no edema SKIN: no acute rash  Diabetic foot exam: Normal inspection No skin breakdown No calluses  Normal DP pulses Normal sensation to light touch and monofilament Nails normal

## 2020-03-15 NOTE — Assessment & Plan Note (Signed)
Living will d/w pt.   Would have his wife designated if patient were incapacitated.  

## 2020-03-15 NOTE — Assessment & Plan Note (Signed)
H/o esophageal ring.  Swallowing well.  Still on PPI.   Continue PPI.

## 2020-03-15 NOTE — Assessment & Plan Note (Signed)
Tetanus 2016 Flu shot 2020 PNA 2014 Shingles d/w pt.   covid vaccine prev done.  D/w pt.   Colonoscopy 2020 Prostate cancer screening and PSA options (with potential risks and benefits of testing vs not testing) were discussed along with recent recs/guidelines.  FH noted, reasonable to check.  Diet and exercise d/w pt. Complicated by working nights.  Encouraged exercise, done mainly at work, he is walking at work. Living will d/w pt. Would have his wife designated if patient were incapacitated.

## 2020-03-15 NOTE — Assessment & Plan Note (Signed)
Continue pravastatin.  Continue work on diet and exercise.  He agrees. 

## 2020-03-15 NOTE — Assessment & Plan Note (Signed)
Ferritin reasonable, LFTs slightly elevated.  No abd pain.  No FCVD.   Recheck periodically.  He agrees.

## 2020-03-15 NOTE — Assessment & Plan Note (Signed)
No change in Metformin at this point.  Continue work on diet and exercise.  He agrees.

## 2020-08-16 ENCOUNTER — Encounter: Payer: Self-pay | Admitting: Family Medicine

## 2020-08-17 MED ORDER — LISINOPRIL 5 MG PO TABS: 5.0000 mg | ORAL_TABLET | Freq: Every day | ORAL | 0 refills | Status: DC

## 2020-08-17 MED ORDER — PRAVASTATIN SODIUM 20 MG PO TABS: ORAL_TABLET | ORAL | 0 refills | Status: DC

## 2020-08-17 MED ORDER — GLIMEPIRIDE 1 MG PO TABS: ORAL_TABLET | ORAL | 0 refills | Status: DC

## 2020-08-17 MED ORDER — METFORMIN HCL 500 MG PO TABS: ORAL_TABLET | ORAL | 0 refills | Status: DC

## 2020-08-17 MED ORDER — LANSOPRAZOLE 15 MG PO CPDR: 15.0000 mg | DELAYED_RELEASE_CAPSULE | Freq: Every day | ORAL | 0 refills | Status: DC

## 2020-08-17 MED ORDER — METOPROLOL SUCCINATE ER 50 MG PO TB24: ORAL_TABLET | ORAL | 0 refills | Status: DC

## 2020-08-22 ENCOUNTER — Other Ambulatory Visit (INDEPENDENT_AMBULATORY_CARE_PROVIDER_SITE_OTHER): Payer: BC Managed Care – PPO

## 2020-08-22 ENCOUNTER — Other Ambulatory Visit: Payer: Self-pay

## 2020-08-22 DIAGNOSIS — E119 Type 2 diabetes mellitus without complications: Secondary | ICD-10-CM

## 2020-08-22 LAB — HEPATIC FUNCTION PANEL
ALT: 132 U/L — ABNORMAL HIGH (ref 0–53)
AST: 69 U/L — ABNORMAL HIGH (ref 0–37)
Albumin: 4.2 g/dL (ref 3.5–5.2)
Alkaline Phosphatase: 77 U/L (ref 39–117)
Bilirubin, Direct: 0.1 mg/dL (ref 0.0–0.3)
Total Bilirubin: 0.8 mg/dL (ref 0.2–1.2)
Total Protein: 7 g/dL (ref 6.0–8.3)

## 2020-08-22 LAB — FERRITIN: Ferritin: 91.9 ng/mL (ref 22.0–322.0)

## 2020-08-22 LAB — HEMOGLOBIN A1C: Hgb A1c MFr Bld: 8 % — ABNORMAL HIGH (ref 4.6–6.5)

## 2020-08-28 ENCOUNTER — Other Ambulatory Visit: Payer: Self-pay

## 2020-08-28 ENCOUNTER — Encounter: Payer: Self-pay | Admitting: Family Medicine

## 2020-08-28 ENCOUNTER — Ambulatory Visit: Payer: BC Managed Care – PPO | Admitting: Family Medicine

## 2020-08-28 DIAGNOSIS — E119 Type 2 diabetes mellitus without complications: Secondary | ICD-10-CM | POA: Diagnosis not present

## 2020-08-28 NOTE — Progress Notes (Signed)
This visit occurred during the SARS-CoV-2 public health emergency.  Safety protocols were in place, including screening questions prior to the visit, additional usage of staff PPE, and extensive cleaning of exam room while observing appropriate contact time as indicated for disinfecting solutions.  Diabetes:  Using medications without difficulties: see below, glimepiride and metformin.   Hypoglycemic episodes: rare, if prolonged fasting.  cautions d/w pt.   Hyperglycemic episodes: no sx Feet problems: no Blood Sugars averaging: not checked recently.   eye exam within last year: due, d/w pt.   He is talking with his wife about food options at home.  He is going to make changes at home.  He had missed some doses of AM metformin.  D/w pt about setting an alarm, etc.  That helps some.    Hemachromatosis.  He has some occ bloating/gas.  No jaundice.  Diet d/w pt.  His ferritin, LFTs and A1c were up.  We talked about phlebotomy vs blood donation.    Meds, vitals, and allergies reviewed.  ROS: Per HPI unless specifically indicated in ROS section   GEN: nad, alert and oriented HEENT: ncat NECK: supple w/o LA CV: rrr. PULM: ctab, no inc wob ABD: soft, +bs EXT: no edema SKIN: no acute rash

## 2020-08-28 NOTE — Patient Instructions (Addendum)
Check your sugar at variable times.  Try to stick with metformin twice a day.   Plan on recheck in about 3 months.  Labs ahead of time if possible.  See about blood donation in the meantime.  Take care.  Glad to see you.

## 2020-09-05 NOTE — Assessment & Plan Note (Signed)
He is talking with his wife about food options at home.  He is going to make changes at home.  He had missed some doses of AM metformin.  D/w pt about setting an alarm, etc.  That helps some.  I think it makes sense to work on diet and medication adherence and then recheck his labs periodically.  See after visit summary.  He agrees.

## 2020-09-05 NOTE — Assessment & Plan Note (Signed)
He has some occ bloating/gas.  No jaundice.  Diet d/w pt.  His ferritin, LFTs and A1c were up.  We talked about phlebotomy vs blood donation.   He will check on blood donation in the meantime.

## 2020-10-22 ENCOUNTER — Other Ambulatory Visit: Payer: Self-pay | Admitting: Family Medicine

## 2020-10-22 DIAGNOSIS — E119 Type 2 diabetes mellitus without complications: Secondary | ICD-10-CM

## 2020-10-28 ENCOUNTER — Other Ambulatory Visit: Payer: Self-pay | Admitting: Family Medicine

## 2020-10-30 ENCOUNTER — Other Ambulatory Visit (INDEPENDENT_AMBULATORY_CARE_PROVIDER_SITE_OTHER): Payer: BC Managed Care – PPO

## 2020-10-30 ENCOUNTER — Other Ambulatory Visit: Payer: Self-pay

## 2020-10-30 DIAGNOSIS — E119 Type 2 diabetes mellitus without complications: Secondary | ICD-10-CM

## 2020-10-30 LAB — HEPATIC FUNCTION PANEL
ALT: 126 U/L — ABNORMAL HIGH (ref 0–53)
AST: 62 U/L — ABNORMAL HIGH (ref 0–37)
Albumin: 4.4 g/dL (ref 3.5–5.2)
Alkaline Phosphatase: 63 U/L (ref 39–117)
Bilirubin, Direct: 0.2 mg/dL (ref 0.0–0.3)
Total Bilirubin: 0.7 mg/dL (ref 0.2–1.2)
Total Protein: 7.1 g/dL (ref 6.0–8.3)

## 2020-10-30 LAB — FERRITIN: Ferritin: 73.1 ng/mL (ref 22.0–322.0)

## 2020-10-30 LAB — HEMOGLOBIN A1C: Hgb A1c MFr Bld: 7 % — ABNORMAL HIGH (ref 4.6–6.5)

## 2020-10-30 NOTE — Telephone Encounter (Signed)
Pharmacy requests refill on: Glimepiride 1 mg, Lisinopril 5 mg, Metformin 500 mg, Pravastatin 20 mg   LAST REFILL: 08/17/2020 LAST OV: 08/28/2020 NEXT OV: 11/06/2020 PHARMACY: [facility] [place], [place]  Hgb A1C (10/30/2020): 7.0

## 2020-11-01 ENCOUNTER — Other Ambulatory Visit: Payer: Self-pay | Admitting: Family Medicine

## 2020-11-06 ENCOUNTER — Ambulatory Visit: Payer: BC Managed Care – PPO | Admitting: Family Medicine

## 2020-11-06 ENCOUNTER — Other Ambulatory Visit: Payer: Self-pay

## 2020-11-06 ENCOUNTER — Encounter: Payer: Self-pay | Admitting: Family Medicine

## 2020-11-06 DIAGNOSIS — H6120 Impacted cerumen, unspecified ear: Secondary | ICD-10-CM | POA: Diagnosis not present

## 2020-11-06 DIAGNOSIS — E119 Type 2 diabetes mellitus without complications: Secondary | ICD-10-CM

## 2020-11-06 NOTE — Patient Instructions (Signed)
Thank you for your effort.  Plan on recheck in about 4 months at a physical.  Labs ahead of time if possible.  Take care.  Glad to see you. Don't change your meds for now.

## 2020-11-06 NOTE — Progress Notes (Signed)
This visit occurred during the SARS-CoV-2 public health emergency.  Safety protocols were in place, including screening questions prior to the visit, additional usage of staff PPE, and extensive cleaning of exam room while observing appropriate contact time as indicated for disinfecting solutions.  Diabetes:  Using medications without difficulties: yes Hypoglycemic episodes:no Hyperglycemic episodes: no Feet problems: no Blood Sugars averaging: usually ~100 fasting, occ down to 80s.   eye exam within last year: yes, done last month.  ([facility]) He is working on not missing his AM metformin.  D/w pt.   A1c d/w pt, lower from prev, down to 7.    LFT elevation d/w pt.   Ferritin is lower on most recent labs, even without giving blood.  He has been paying attention to his diet.  Work situation d/w pt.  He had extra work since his supervisor was out.  He feels abd discomfort and dec in appetite when he checks in at work, relief when he clocks out.  Supervisor hopefully to return soon, d/w pt.  Dec weight in the meantime, attributed to combination of stress and diet- he cut out snacks prior to napping/sleeping.  He can update me if his work situation is worse in the meantime; he thought it would be manageable until his supervisor comes back in his previous routine resumes.  He noted wax in R canal recently.    Meds, vitals, and allergies reviewed.  ROS: Per HPI unless specifically indicated in ROS section   GEN: nad, alert and oriented HEENT: ncat NECK: supple w/o LA CV: rrr PULM: ctab, no inc wob ABD: soft, +bs EXT: no edema SKIN: no acute rash  Wax noted in right ear canal.  No wax removed with irritation R ear canal.  D/w pt about debrox use and repeat irrigation at home.  No complication.   Narrow R ear canal compared to L canal.

## 2020-11-08 DIAGNOSIS — H6120 Impacted cerumen, unspecified ear: Secondary | ICD-10-CM | POA: Insufficient documentation

## 2020-11-08 NOTE — Assessment & Plan Note (Signed)
See above.  Discussed home treatment.

## 2020-11-08 NOTE — Assessment & Plan Note (Signed)
A1c d/w pt, lower from prev, down to 7.   He is working on making sure he does not miss his Metformin doses.  He still taking glimepiride at baseline.  Would continue work on diet and exercise and recheck periodically.  He agrees.

## 2020-11-08 NOTE — Assessment & Plan Note (Signed)
LFT elevation d/w pt.   Ferritin is lower on most recent labs, even without giving blood.  He has been paying attention to his diet.  Would continue as is.

## 2021-02-16 ENCOUNTER — Other Ambulatory Visit: Payer: Self-pay | Admitting: Family Medicine

## 2021-02-19 MED ORDER — METOPROLOL SUCCINATE ER 50 MG PO TB24: ORAL_TABLET | ORAL | 2 refills | Status: DC

## 2021-02-20 ENCOUNTER — Telehealth: Payer: Self-pay

## 2021-02-20 NOTE — Telephone Encounter (Signed)
Spoke with [facility] about patients rx. All information needed was given.

## 2021-02-20 NOTE — Telephone Encounter (Signed)
Triage vm from [facility] requesting clarification of directions on metoprolol succinate.  Plz advise at [phone].

## 2021-02-28 ENCOUNTER — Encounter: Payer: Self-pay | Admitting: Family Medicine

## 2021-02-28 ENCOUNTER — Other Ambulatory Visit: Payer: Self-pay | Admitting: Family Medicine

## 2021-02-28 DIAGNOSIS — E119 Type 2 diabetes mellitus without complications: Secondary | ICD-10-CM

## 2021-02-28 DIAGNOSIS — Z8042 Family history of malignant neoplasm of prostate: Secondary | ICD-10-CM | POA: Insufficient documentation

## 2021-03-02 ENCOUNTER — Other Ambulatory Visit: Payer: Self-pay

## 2021-03-02 ENCOUNTER — Other Ambulatory Visit (INDEPENDENT_AMBULATORY_CARE_PROVIDER_SITE_OTHER): Payer: BC Managed Care – PPO

## 2021-03-02 DIAGNOSIS — E119 Type 2 diabetes mellitus without complications: Secondary | ICD-10-CM

## 2021-03-02 LAB — COMPREHENSIVE METABOLIC PANEL
ALT: 90 U/L — ABNORMAL HIGH (ref 0–53)
AST: 56 U/L — ABNORMAL HIGH (ref 0–37)
Albumin: 4.3 g/dL (ref 3.5–5.2)
Alkaline Phosphatase: 59 U/L (ref 39–117)
BUN: 11 mg/dL (ref 6–23)
CO2: 26 mEq/L (ref 19–32)
Calcium: 9.3 mg/dL (ref 8.4–10.5)
Chloride: 102 mEq/L (ref 96–112)
Creatinine, Ser: 0.84 mg/dL (ref 0.40–1.50)
GFR: 100.3 mL/min (ref >60.00)
Glucose, Bld: 135 mg/dL — ABNORMAL HIGH (ref 70–99)
Potassium: 4.5 mEq/L (ref 3.5–5.1)
Sodium: 138 mEq/L (ref 135–145)
Total Bilirubin: 0.8 mg/dL (ref 0.2–1.2)
Total Protein: 6.7 g/dL (ref 6.0–8.3)

## 2021-03-02 LAB — CBC WITH DIFFERENTIAL/PLATELET
Basophils Absolute: 0 10*3/uL (ref 0.0–0.1)
Basophils Relative: 0.7 % (ref 0.0–3.0)
Eosinophils Absolute: 0.3 10*3/uL (ref 0.0–0.7)
Eosinophils Relative: 4.4 % (ref 0.0–5.0)
HCT: 42.9 % (ref 39.0–52.0)
Hemoglobin: 15 g/dL (ref 13.0–17.0)
Lymphocytes Relative: 39.7 % (ref 12.0–46.0)
Lymphs Abs: 2.7 10*3/uL (ref 0.7–4.0)
MCHC: 35 g/dL (ref 30.0–36.0)
MCV: 87.7 fl (ref 78.0–100.0)
Monocytes Absolute: 0.4 10*3/uL (ref 0.1–1.0)
Monocytes Relative: 6.3 % (ref 3.0–12.0)
Neutro Abs: 3.3 10*3/uL (ref 1.4–7.7)
Neutrophils Relative %: 48.9 % (ref 43.0–77.0)
Platelets: 287 10*3/uL (ref 150.0–400.0)
RBC: 4.89 Mil/uL (ref 4.22–5.81)
RDW: 12.7 % (ref 11.5–15.5)
WBC: 6.7 10*3/uL (ref 4.0–10.5)

## 2021-03-02 LAB — LIPID PANEL
Cholesterol: 151 mg/dL (ref 0–200)
HDL: 38.7 mg/dL — ABNORMAL LOW (ref >39.00)
LDL Cholesterol: 90 mg/dL (ref 0–99)
NonHDL: 111.92
Total CHOL/HDL Ratio: 4
Triglycerides: 110 mg/dL (ref 0.0–149.0)
VLDL: 22 mg/dL (ref 0.0–40.0)

## 2021-03-02 LAB — HEMOGLOBIN A1C: Hgb A1c MFr Bld: 7 % — ABNORMAL HIGH (ref 4.6–6.5)

## 2021-03-02 LAB — FERRITIN: Ferritin: 77 ng/mL (ref 22.0–322.0)

## 2021-03-02 LAB — PSA: PSA: 0.51 ng/mL (ref 0.10–4.00)

## 2021-03-09 ENCOUNTER — Encounter: Payer: BC Managed Care – PPO | Admitting: Family Medicine

## 2021-03-13 ENCOUNTER — Encounter: Payer: Self-pay | Admitting: Family Medicine

## 2021-03-13 ENCOUNTER — Other Ambulatory Visit: Payer: Self-pay

## 2021-03-13 ENCOUNTER — Ambulatory Visit (INDEPENDENT_AMBULATORY_CARE_PROVIDER_SITE_OTHER): Payer: BC Managed Care – PPO | Admitting: Family Medicine

## 2021-03-13 VITALS — BP 160/102 | HR 71 | Temp 97.5°F | Ht 65.0 in | Wt 194.0 lb

## 2021-03-13 DIAGNOSIS — Z7189 Other specified counseling: Secondary | ICD-10-CM

## 2021-03-13 DIAGNOSIS — Z0000 Encounter for general adult medical examination without abnormal findings: Secondary | ICD-10-CM | POA: Diagnosis not present

## 2021-03-13 DIAGNOSIS — E7800 Pure hypercholesterolemia, unspecified: Secondary | ICD-10-CM

## 2021-03-13 DIAGNOSIS — K219 Gastro-esophageal reflux disease without esophagitis: Secondary | ICD-10-CM

## 2021-03-13 DIAGNOSIS — E119 Type 2 diabetes mellitus without complications: Secondary | ICD-10-CM

## 2021-03-13 DIAGNOSIS — I10 Essential (primary) hypertension: Secondary | ICD-10-CM

## 2021-03-13 MED ORDER — METOPROLOL SUCCINATE ER 50 MG PO TB24: ORAL_TABLET | ORAL | 2 refills | Status: DC

## 2021-03-13 MED ORDER — LISINOPRIL 5 MG PO TABS: 10.0000 mg | ORAL_TABLET | Freq: Every day | ORAL | Status: DC

## 2021-03-13 NOTE — Patient Instructions (Addendum)
Call about an eye exam when possible.   Take care.  Glad to see you. Plan on recheck with labs prior to a visit in about 3-6 months.   Try increasing the lisinopril to 10mg  a day for about 2 weeks.  If needed, increase to 15mg  a day.  If you increase to 15mg  a day, let me know.  Goal BP <140/<90.

## 2021-03-13 NOTE — Progress Notes (Signed)
This visit occurred during the SARS-CoV-2 public health emergency.  Safety protocols were in place, including screening questions prior to the visit, additional usage of staff PPE, and extensive cleaning of exam room while observing appropriate contact time as indicated for disinfecting solutions.  CPE- See plan.  Routine anticipatory guidance given to patient.  See health maintenance.  The possibility exists that previously documented standard health maintenance information may have been brought forward from a previous encounter into this note.  If needed, that same information has been updated to reflect the current situation based on today's encounter.    Tetanus 2016 Flu shot 2021 PNA 2014 Shingles d/w pt.   covid vaccine prev done.  D/w pt.   Colonoscopy 2020 Prostate cancer screening and PSA options (with potential risks and benefits of testing vs not testing) were discussed along with recent recs/guidelines.  FH noted.  2022.  Diet and exercise d/w pt.  Complicated by working nights.    Encouraged exercise, done mainly at work, he is walking at work. Living will d/w pt.   Would have his wife designated if patient were incapacitated.   Hemachromatosis.  LFTs improved.  Ferritin not high, d/w pt. he is monitoring his diet.  He has not gone for blood donation recently.  Diabetes:  Using medications without difficulties: yes, metformin and glimepiride.   Hypoglycemic episodes: no (lowest is in the 90s) Hyperglycemic episodes: no (up to 200s right after eating) Feet problems: no Blood Sugars averaging: see above.   eye exam within last year: done last year  Elevated Cholesterol: Using medications without problems:yes Muscle aches: no Diet compliance: encouraged.   Exercise: encouraged Labs d/w pt.   Hypertension:    Using medication without problems or lightheadedness: yes Chest pain with exertion:no Edema:no Short of breath:no  GERD.  Still on PPI with h/o dilation noted.   Swallowing well.    PMH and SH reviewed  Meds, vitals, and allergies reviewed.   ROS: Per HPI.  Unless specifically indicated otherwise in HPI, the patient denies:  General: fever. Eyes: acute vision changes ENT: sore throat Cardiovascular: chest pain Respiratory: SOB GI: vomiting GU: dysuria Musculoskeletal: acute back pain Derm: acute rash Neuro: acute motor dysfunction Psych: worsening mood Endocrine: polydipsia Heme: bleeding Allergy: hayfever  GEN: nad, alert and oriented HEENT: NCAT NECK: supple w/o LA CV: rrr. PULM: ctab, no inc wob ABD: soft, +bs EXT: no edema SKIN: no acute rash  Diabetic foot exam: Normal inspection No skin breakdown No calluses  Normal DP pulses Normal sensation to light touch and monofilament Nails normal

## 2021-03-14 NOTE — Assessment & Plan Note (Addendum)
Continue metoprolol lisinopril.  Continue work on diet and exercise. I asked him to try increasing the lisinopril to 10mg  a day for about 2 weeks.  If needed, increase to 15mg  a day.  If increase to 15mg  a day, then I want him to let me know.  Goal BP <140/<90. Rationale discussed with patient.  He agrees to plan.

## 2021-03-14 NOTE — Assessment & Plan Note (Signed)
Tetanus 2016 Flu shot 2021 PNA 2014 Shingles d/w pt.   covid vaccine prev done.  D/w pt.   Colonoscopy 2020 Prostate cancer screening and PSA options (with potential risks and benefits of testing vs not testing) were discussed along with recent recs/guidelines.  FH noted.  2022.  Diet and exercise d/w pt.  Complicated by working nights.    Encouraged exercise, done mainly at work, he is walking at work. Living will d/w pt.   Would have his wife designated if patient were incapacitated.

## 2021-03-14 NOTE — Assessment & Plan Note (Signed)
Living will d/w pt.   Would have his wife designated if patient were incapacitated.  

## 2021-03-14 NOTE — Assessment & Plan Note (Signed)
A1c stable.  Continue glimepiride and metformin.  Continue work on diet and exercise.  Recheck periodically.  He agrees to plan.

## 2021-03-14 NOTE — Assessment & Plan Note (Signed)
Continue diet and exercise.  Continue pravastatin.  Labs discussed with patient.

## 2021-03-14 NOTE — Assessment & Plan Note (Signed)
Would continue lansoprazole.  History of dilation noted.

## 2021-03-14 NOTE — Assessment & Plan Note (Signed)
LFTs improved.  Ferritin not high, d/w pt. he is monitoring his diet.  He has not gone for blood donation recently.  We can recheck his labs periodically.

## 2021-04-18 ENCOUNTER — Other Ambulatory Visit: Payer: Self-pay | Admitting: Family Medicine

## 2021-04-27 ENCOUNTER — Other Ambulatory Visit: Payer: Self-pay | Admitting: Family Medicine

## 2021-06-10 ENCOUNTER — Encounter: Payer: Self-pay | Admitting: Primary Care

## 2021-06-26 ENCOUNTER — Other Ambulatory Visit: Payer: Self-pay | Admitting: Family Medicine

## 2021-06-27 LAB — HM DIABETES EYE EXAM

## 2021-07-10 ENCOUNTER — Encounter: Payer: Self-pay | Admitting: Family Medicine

## 2021-07-10 ENCOUNTER — Ambulatory Visit: Payer: BC Managed Care – PPO | Admitting: Family Medicine

## 2021-07-10 ENCOUNTER — Other Ambulatory Visit: Payer: Self-pay

## 2021-07-10 DIAGNOSIS — I10 Essential (primary) hypertension: Secondary | ICD-10-CM

## 2021-07-10 DIAGNOSIS — E119 Type 2 diabetes mellitus without complications: Secondary | ICD-10-CM

## 2021-07-10 LAB — HEMOGLOBIN A1C: Hgb A1c MFr Bld: 7.8 % — ABNORMAL HIGH (ref 4.6–6.5)

## 2021-07-10 LAB — COMPREHENSIVE METABOLIC PANEL
ALT: 119 U/L — ABNORMAL HIGH (ref 0–53)
AST: 66 U/L — ABNORMAL HIGH (ref 0–37)
Albumin: 4.6 g/dL (ref 3.5–5.2)
Alkaline Phosphatase: 68 U/L (ref 39–117)
BUN: 13 mg/dL (ref 6–23)
CO2: 26 mEq/L (ref 19–32)
Calcium: 9.7 mg/dL (ref 8.4–10.5)
Chloride: 102 mEq/L (ref 96–112)
Creatinine, Ser: 0.95 mg/dL (ref 0.40–1.50)
GFR: 91.83 mL/min (ref >60.00)
Glucose, Bld: 103 mg/dL — ABNORMAL HIGH (ref 70–99)
Potassium: 4.6 mEq/L (ref 3.5–5.1)
Sodium: 136 mEq/L (ref 135–145)
Total Bilirubin: 0.8 mg/dL (ref 0.2–1.2)
Total Protein: 7.3 g/dL (ref 6.0–8.3)

## 2021-07-10 LAB — FERRITIN: Ferritin: 104.6 ng/mL (ref 22.0–322.0)

## 2021-07-10 MED ORDER — LISINOPRIL 5 MG PO TABS: 10.0000 mg | ORAL_TABLET | Freq: Every day | ORAL | Status: DC

## 2021-07-10 NOTE — Progress Notes (Signed)
This visit occurred during the SARS-CoV-2 public health emergency.  Safety protocols were in place, including screening questions prior to the visit, additional usage of staff PPE, and extensive cleaning of exam room while observing appropriate contact time as indicated for disinfecting solutions.  Diabetes:  Using medications without difficulties: yes Hypoglycemic episodes: no sx Hyperglycemic episodes: no sx Feet problems:no Blood Sugars averaging: not checked often.   eye exam within last year: yes Labs pending.   He had one episode of brief sx with likely vertigo that resolved.  No sx in the meantime.  I asked him to monitor his sx.  He wasn't fasting at the time.    Hypertension.  Blood pressure still elevated at office visit today.  He had some aches after inc in lisinopril to 10mg  a day.  L arm sx resolved, still with some occ R biceps pain.  No chest pain.  He got a beagle puppy yesterday, named Luna.  His kids are happy.  He is still working on [employer].    Hemachromatosis.  LFTs prev improved.  He is monitoring his diet.  He has not gone for blood donation recently.  Recheck labs pending.  See notes on labs.  No jaundice.  No abdominal pain.  Meds, vitals, and allergies reviewed.   ROS: Per HPI unless specifically indicated in ROS section   GEN: nad, alert and oriented HEENT: ncat NECK: supple w/o LA CV: rrr. PULM: ctab, no inc wob ABD: soft, +bs EXT: no edema SKIN: well perfused.

## 2021-07-10 NOTE — Patient Instructions (Addendum)
Go to the lab on the way out.   If you have mychart we'll likely use that to update you.    Take care.  Glad to see you. Plan on recheck in about 4 months.    Assuming your labs are fine, then check your BP at home.  If labs are fine and BP is still >140/>90, then try increasing the lisinopril to 15mg  a day.    Don't make the change yet- until you get the labs and recheck your pressure.   If the aches get worse on the higher dose, let me know.

## 2021-07-11 NOTE — Assessment & Plan Note (Signed)
Discussed options.  Check labs today.  See notes on labs  Assuming his labs are fine, then I want him to check his BP at home.  If labs are fine and BP is still >140/>90, then try increasing the lisinopril to 15mg  a day.    If the aches get worse on the higher dose, let he will me know.

## 2021-07-11 NOTE — Assessment & Plan Note (Signed)
Recheck labs pending.  No jaundice.  No abdominal pain.  He is working to avoid foods that would elevate his ferritin.  He has not required therapeutic phlebotomy recently.

## 2021-07-11 NOTE — Assessment & Plan Note (Signed)
Continue glimepiride and metformin.  See notes on labs.

## 2021-08-02 ENCOUNTER — Other Ambulatory Visit: Payer: Self-pay | Admitting: Family Medicine

## 2021-10-18 ENCOUNTER — Encounter: Payer: Self-pay | Admitting: Family Medicine

## 2021-10-18 ENCOUNTER — Other Ambulatory Visit: Payer: Self-pay | Admitting: Family Medicine

## 2021-10-19 ENCOUNTER — Other Ambulatory Visit: Payer: Self-pay | Admitting: Family Medicine

## 2021-10-19 MED ORDER — LISINOPRIL 5 MG PO TABS: 15.0000 mg | ORAL_TABLET | Freq: Every day | ORAL | 3 refills | Status: DC

## 2022-01-11 ENCOUNTER — Ambulatory Visit (INDEPENDENT_AMBULATORY_CARE_PROVIDER_SITE_OTHER): Payer: BC Managed Care – PPO | Admitting: Family Medicine

## 2022-01-11 ENCOUNTER — Encounter: Payer: Self-pay | Admitting: Family Medicine

## 2022-01-11 DIAGNOSIS — E119 Type 2 diabetes mellitus without complications: Secondary | ICD-10-CM | POA: Diagnosis not present

## 2022-01-11 DIAGNOSIS — E7800 Pure hypercholesterolemia, unspecified: Secondary | ICD-10-CM

## 2022-01-11 DIAGNOSIS — I10 Essential (primary) hypertension: Secondary | ICD-10-CM

## 2022-01-11 DIAGNOSIS — M7710 Lateral epicondylitis, unspecified elbow: Secondary | ICD-10-CM

## 2022-01-11 LAB — LIPID PANEL
Cholesterol: 171 mg/dL (ref 0–200)
HDL: 43.2 mg/dL (ref >39.00)
LDL Cholesterol: 109 mg/dL — ABNORMAL HIGH (ref 0–99)
NonHDL: 127.76
Total CHOL/HDL Ratio: 4
Triglycerides: 95 mg/dL (ref 0.0–149.0)
VLDL: 19 mg/dL (ref 0.0–40.0)

## 2022-01-11 LAB — CBC WITH DIFFERENTIAL/PLATELET
Basophils Absolute: 0.1 10*3/uL (ref 0.0–0.1)
Basophils Relative: 0.7 % (ref 0.0–3.0)
Eosinophils Absolute: 0.2 10*3/uL (ref 0.0–0.7)
Eosinophils Relative: 2.5 % (ref 0.0–5.0)
HCT: 44.8 % (ref 39.0–52.0)
Hemoglobin: 15 g/dL (ref 13.0–17.0)
Lymphocytes Relative: 41 % (ref 12.0–46.0)
Lymphs Abs: 3.8 10*3/uL (ref 0.7–4.0)
MCHC: 33.5 g/dL (ref 30.0–36.0)
MCV: 91.1 fl (ref 78.0–100.0)
Monocytes Absolute: 0.6 10*3/uL (ref 0.1–1.0)
Monocytes Relative: 6.8 % (ref 3.0–12.0)
Neutro Abs: 4.5 10*3/uL (ref 1.4–7.7)
Neutrophils Relative %: 49 % (ref 43.0–77.0)
Platelets: 289 10*3/uL (ref 150.0–400.0)
RBC: 4.92 Mil/uL (ref 4.22–5.81)
RDW: 13 % (ref 11.5–15.5)
WBC: 9.2 10*3/uL (ref 4.0–10.5)

## 2022-01-11 LAB — COMPREHENSIVE METABOLIC PANEL
ALT: 117 U/L — ABNORMAL HIGH (ref 0–53)
AST: 78 U/L — ABNORMAL HIGH (ref 0–37)
Albumin: 4.7 g/dL (ref 3.5–5.2)
Alkaline Phosphatase: 71 U/L (ref 39–117)
BUN: 17 mg/dL (ref 6–23)
CO2: 26 mEq/L (ref 19–32)
Calcium: 9.8 mg/dL (ref 8.4–10.5)
Chloride: 104 mEq/L (ref 96–112)
Creatinine, Ser: 0.99 mg/dL (ref 0.40–1.50)
GFR: 87.08 mL/min (ref >60.00)
Glucose, Bld: 106 mg/dL — ABNORMAL HIGH (ref 70–99)
Potassium: 4.3 mEq/L (ref 3.5–5.1)
Sodium: 140 mEq/L (ref 135–145)
Total Bilirubin: 0.7 mg/dL (ref 0.2–1.2)
Total Protein: 7.3 g/dL (ref 6.0–8.3)

## 2022-01-11 LAB — FERRITIN: Ferritin: 68.5 ng/mL (ref 22.0–322.0)

## 2022-01-11 LAB — HEMOGLOBIN A1C: Hgb A1c MFr Bld: 7.2 % — ABNORMAL HIGH (ref 4.6–6.5)

## 2022-01-11 NOTE — Patient Instructions (Addendum)
Go to the lab on the way out.   If you have mychart we'll likely use that to update you.    Take care.  Glad to see you. Hold pravastatin for about 10 days.  See if the aches get better.   We'll see about BP options and follow up when I see your labs.   Restart using the tennis elbow strap and ice as needed.

## 2022-01-11 NOTE — Progress Notes (Signed)
Hemochromatosis.  Due for labs.  No recent therapeutic phlebotomy.  No jaundice.  No abd pain.  See notes on labs.  D/w pt about diet, he had some red meat occ.    We talked about his work situation.  He is still working nights, working long hours.    Hypertension:    Using medication without problems or lightheadedness: rarely lightheaded, d/w pt.   Chest pain with exertion:no exertional pain but has noted some diffuse aches. Edema:no Short of breath:no  Diabetes:  Using medications without difficulties: yes Hypoglycemic episodes: no Hyperglycemic episodes: no Feet problems:  some tingling after standing for 12 hours at work.   Blood Sugars averaging: no sx eye exam within last year: yes Labs pending.   Meds, vitals, and allergies reviewed.  ROS: Per HPI unless specifically indicated in ROS section   GEN: nad, alert and oriented HEENT: ncat NECK: supple w/o LA CV: rrr. PULM: ctab, no inc wob ABD: soft, +bs EXT: no edema SKIN: no acute rash Right elbow with tenderness at medial and lateral malleolus, noted on testing for resisted pronation and supination, symptoms improved with compression of the proximal forearm.  Olecranon nontender.  No bruising or erythema.  Normal elbow range of motion otherwise.

## 2022-01-13 ENCOUNTER — Other Ambulatory Visit: Payer: Self-pay | Admitting: Family Medicine

## 2022-01-13 DIAGNOSIS — M7710 Lateral epicondylitis, unspecified elbow: Secondary | ICD-10-CM | POA: Insufficient documentation

## 2022-01-13 MED ORDER — LISINOPRIL 5 MG PO TABS: 20.0000 mg | ORAL_TABLET | Freq: Every day | ORAL | Status: DC

## 2022-01-13 NOTE — Assessment & Plan Note (Signed)
See notes on labs. 

## 2022-01-13 NOTE — Assessment & Plan Note (Signed)
Discussed icing as needed for 5 minutes at a time using a tennis elbow strap.  He can update me as needed.

## 2022-01-13 NOTE — Assessment & Plan Note (Signed)
I do want to change his medication before checking his labs.  Discussed with patient.  Continue lisinopril 15 mg for now.  Continue metoprolol 100 mg a day.

## 2022-01-13 NOTE — Assessment & Plan Note (Signed)
He will hold pravastatin for 10 days and see if aches improved.

## 2022-01-13 NOTE — Assessment & Plan Note (Signed)
Continue metformin, continue glimepiride.  See notes on labs.

## 2022-01-16 ENCOUNTER — Other Ambulatory Visit: Payer: Self-pay | Admitting: Family Medicine

## 2022-01-16 MED ORDER — LISINOPRIL 20 MG PO TABS: 20.0000 mg | ORAL_TABLET | Freq: Every day | ORAL | 3 refills | Status: DC

## 2022-01-18 ENCOUNTER — Telehealth: Payer: Self-pay | Admitting: Family Medicine

## 2022-01-18 NOTE — Telephone Encounter (Signed)
Micheal Cruz from [place] wants to know if Dr. Damita Dunnings had changed the mg's to 20 mg from 5 mg on this medication: lisinopril (ZESTRIL) 20 MG tablet. They had a 5 mg on the pt's medication list back from 10/19/2021, they just need a clarification on if they need to discontinue the 5 mg or keep it on the pt's med list. Please return a call back when possible.  Callback Number: [phone] (Direct Number) Fax Number: [phone]

## 2022-01-18 NOTE — Telephone Encounter (Signed)
[place] and notified them patient is supposed to be taking lisinopril 20 mg tablets; no longer taking the 5 mg tablets.

## 2022-01-27 ENCOUNTER — Other Ambulatory Visit: Payer: Self-pay | Admitting: Family Medicine

## 2022-04-17 ENCOUNTER — Other Ambulatory Visit: Payer: Self-pay | Admitting: Family Medicine

## 2022-05-11 ENCOUNTER — Other Ambulatory Visit: Payer: Self-pay | Admitting: Family Medicine

## 2022-05-17 ENCOUNTER — Encounter: Payer: Self-pay | Admitting: Family Medicine

## 2022-05-17 ENCOUNTER — Other Ambulatory Visit (INDEPENDENT_AMBULATORY_CARE_PROVIDER_SITE_OTHER): Payer: BC Managed Care – PPO

## 2022-05-17 ENCOUNTER — Ambulatory Visit: Payer: BC Managed Care – PPO | Admitting: Family Medicine

## 2022-05-17 ENCOUNTER — Other Ambulatory Visit: Payer: BC Managed Care – PPO

## 2022-05-17 VITALS — BP 138/90 | HR 63 | Temp 97.6°F | Ht 65.0 in | Wt 195.0 lb

## 2022-05-17 DIAGNOSIS — Z23 Encounter for immunization: Secondary | ICD-10-CM

## 2022-05-17 DIAGNOSIS — E119 Type 2 diabetes mellitus without complications: Secondary | ICD-10-CM

## 2022-05-17 LAB — CBC WITH DIFFERENTIAL/PLATELET
Basophils Absolute: 0 10*3/uL (ref 0.0–0.1)
Basophils Relative: 0.4 % (ref 0.0–3.0)
Eosinophils Absolute: 0.3 10*3/uL (ref 0.0–0.7)
Eosinophils Relative: 3.3 % (ref 0.0–5.0)
HCT: 42.4 % (ref 39.0–52.0)
Hemoglobin: 14.5 g/dL (ref 13.0–17.0)
Lymphocytes Relative: 41 % (ref 12.0–46.0)
Lymphs Abs: 3.5 10*3/uL (ref 0.7–4.0)
MCHC: 34.2 g/dL (ref 30.0–36.0)
MCV: 89.8 fl (ref 78.0–100.0)
Monocytes Absolute: 0.5 10*3/uL (ref 0.1–1.0)
Monocytes Relative: 6.1 % (ref 3.0–12.0)
Neutro Abs: 4.2 10*3/uL (ref 1.4–7.7)
Neutrophils Relative %: 49.2 % (ref 43.0–77.0)
Platelets: 246 10*3/uL (ref 150.0–400.0)
RBC: 4.73 Mil/uL (ref 4.22–5.81)
RDW: 12.8 % (ref 11.5–15.5)
WBC: 8.5 10*3/uL (ref 4.0–10.5)

## 2022-05-17 LAB — FERRITIN: Ferritin: 65 ng/mL (ref 22.0–322.0)

## 2022-05-17 LAB — COMPREHENSIVE METABOLIC PANEL
ALT: 92 U/L — ABNORMAL HIGH (ref 0–53)
AST: 60 U/L — ABNORMAL HIGH (ref 0–37)
Albumin: 4.3 g/dL (ref 3.5–5.2)
Alkaline Phosphatase: 69 U/L (ref 39–117)
BUN: 18 mg/dL (ref 6–23)
CO2: 25 mEq/L (ref 19–32)
Calcium: 9.3 mg/dL (ref 8.4–10.5)
Chloride: 105 mEq/L (ref 96–112)
Creatinine, Ser: 0.94 mg/dL (ref 0.40–1.50)
GFR: 92.45 mL/min (ref >60.00)
Glucose, Bld: 115 mg/dL — ABNORMAL HIGH (ref 70–99)
Potassium: 4.2 mEq/L (ref 3.5–5.1)
Sodium: 140 mEq/L (ref 135–145)
Total Bilirubin: 0.8 mg/dL (ref 0.2–1.2)
Total Protein: 7 g/dL (ref 6.0–8.3)

## 2022-05-17 NOTE — Progress Notes (Unsigned)
Diabetes:  Using medications without difficulties: yes Hypoglycemic episodes:no sx  Hyperglycemic episodes: no sx Feet problems: L lateral foot soreness with one episode of local paresthesia.  He walks a lot at work.  He has to wear steel toe shoe at work.  He has heavy lateral heel strike pattern on tread wear.   Blood Sugars averaging: not checked.   eye exam within last year: yes  Labs pending.    Hemochromatosis.  Labs pending.  He is working on diet.    His daughter was having concerns with turning in school assignments and he is checking with school staff.  Support offered.  He has been having some foot pain, he has to wear steel toed shoes at work.  Discussed options.  See AVS.  Meds, vitals, and allergies reviewed.  ROS: Per HPI unless specifically indicated in ROS section   GEN: nad, alert and oriented HEENT: mucous membranes moist NECK: supple w/o LA CV: rrr. PULM: ctab, no inc wob ABD: soft, +bs EXT: no edema SKIN: no acute rash  Diabetic foot exam: Normal inspection No skin breakdown No calluses  Normal DP pulses Normal sensation to light touch and monofilament Nails normal

## 2022-05-17 NOTE — Patient Instructions (Addendum)
Recheck in about 4 months at a physical, labs ahead of time.  Go to the lab on the way out.   If you have mychart we'll likely use that to update you.   Take care.  Glad to see you. Change shoes, use a soft insert, then use a lace up ankle brace if needed.

## 2022-05-20 LAB — HEMOGLOBIN A1C: Hgb A1c MFr Bld: 8.4 % — ABNORMAL HIGH (ref 4.6–6.5)

## 2022-05-20 NOTE — Assessment & Plan Note (Signed)
Continue limiting red meat, etc.  See notes on labs.

## 2022-05-20 NOTE — Assessment & Plan Note (Signed)
Continue glimepiride and metformin.  See notes on labs.  Continue work on diet and exercise.

## 2022-05-22 ENCOUNTER — Other Ambulatory Visit: Payer: Self-pay | Admitting: Family Medicine

## 2022-05-24 ENCOUNTER — Other Ambulatory Visit: Payer: Self-pay | Admitting: Family Medicine

## 2022-05-24 MED ORDER — GLIMEPIRIDE 1 MG PO TABS: ORAL_TABLET | ORAL | Status: DC

## 2022-08-08 ENCOUNTER — Other Ambulatory Visit: Payer: Self-pay | Admitting: Family Medicine

## 2022-08-23 ENCOUNTER — Other Ambulatory Visit: Payer: Self-pay | Admitting: Family Medicine

## 2022-08-30 ENCOUNTER — Other Ambulatory Visit: Payer: Self-pay | Admitting: Family Medicine

## 2022-09-11 ENCOUNTER — Other Ambulatory Visit: Payer: Self-pay | Admitting: Family Medicine

## 2022-09-11 DIAGNOSIS — E7800 Pure hypercholesterolemia, unspecified: Secondary | ICD-10-CM

## 2022-09-11 DIAGNOSIS — Z125 Encounter for screening for malignant neoplasm of prostate: Secondary | ICD-10-CM

## 2022-09-11 DIAGNOSIS — E119 Type 2 diabetes mellitus without complications: Secondary | ICD-10-CM

## 2022-09-16 ENCOUNTER — Other Ambulatory Visit (INDEPENDENT_AMBULATORY_CARE_PROVIDER_SITE_OTHER): Payer: BC Managed Care – PPO

## 2022-09-16 DIAGNOSIS — E119 Type 2 diabetes mellitus without complications: Secondary | ICD-10-CM

## 2022-09-16 DIAGNOSIS — E7800 Pure hypercholesterolemia, unspecified: Secondary | ICD-10-CM

## 2022-09-16 DIAGNOSIS — Z125 Encounter for screening for malignant neoplasm of prostate: Secondary | ICD-10-CM | POA: Diagnosis not present

## 2022-09-16 LAB — COMPREHENSIVE METABOLIC PANEL
ALT: 122 U/L — ABNORMAL HIGH (ref 0–53)
AST: 71 U/L — ABNORMAL HIGH (ref 0–37)
Albumin: 4.3 g/dL (ref 3.5–5.2)
Alkaline Phosphatase: 75 U/L (ref 39–117)
BUN: 16 mg/dL (ref 6–23)
CO2: 28 mEq/L (ref 19–32)
Calcium: 9.2 mg/dL (ref 8.4–10.5)
Chloride: 99 mEq/L (ref 96–112)
Creatinine, Ser: 0.82 mg/dL (ref 0.40–1.50)
GFR: 99.94 mL/min (ref >60.00)
Glucose, Bld: 131 mg/dL — ABNORMAL HIGH (ref 70–99)
Potassium: 4.4 mEq/L (ref 3.5–5.1)
Sodium: 135 mEq/L (ref 135–145)
Total Bilirubin: 0.8 mg/dL (ref 0.2–1.2)
Total Protein: 6.7 g/dL (ref 6.0–8.3)

## 2022-09-16 LAB — CBC WITH DIFFERENTIAL/PLATELET
Basophils Absolute: 0.2 10*3/uL — ABNORMAL HIGH (ref 0.0–0.1)
Basophils Relative: 2.4 % (ref 0.0–3.0)
Eosinophils Absolute: 0.3 10*3/uL (ref 0.0–0.7)
Eosinophils Relative: 3.3 % (ref 0.0–5.0)
HCT: 42.1 % (ref 39.0–52.0)
Hemoglobin: 14.4 g/dL (ref 13.0–17.0)
Lymphocytes Relative: 42 % (ref 12.0–46.0)
Lymphs Abs: 3.2 10*3/uL (ref 0.7–4.0)
MCHC: 34.1 g/dL (ref 30.0–36.0)
MCV: 89.9 fl (ref 78.0–100.0)
Monocytes Absolute: 0.5 10*3/uL (ref 0.1–1.0)
Monocytes Relative: 6.2 % (ref 3.0–12.0)
Neutro Abs: 3.6 10*3/uL (ref 1.4–7.7)
Neutrophils Relative %: 46.1 % (ref 43.0–77.0)
Platelets: 270 10*3/uL (ref 150.0–400.0)
RBC: 4.68 Mil/uL (ref 4.22–5.81)
RDW: 12.9 % (ref 11.5–15.5)
WBC: 7.7 10*3/uL (ref 4.0–10.5)

## 2022-09-16 LAB — LIPID PANEL
Cholesterol: 152 mg/dL (ref 0–200)
HDL: 39.2 mg/dL (ref >39.00)
LDL Cholesterol: 87 mg/dL (ref 0–99)
NonHDL: 113.26
Total CHOL/HDL Ratio: 4
Triglycerides: 129 mg/dL (ref 0.0–149.0)
VLDL: 25.8 mg/dL (ref 0.0–40.0)

## 2022-09-16 LAB — MICROALBUMIN / CREATININE URINE RATIO
Creatinine,U: 85.7 mg/dL
Microalb Creat Ratio: 1.1 mg/g (ref 0.0–30.0)
Microalb, Ur: 0.9 mg/dL (ref 0.0–1.9)

## 2022-09-16 LAB — HEMOGLOBIN A1C: Hgb A1c MFr Bld: 9.3 % — ABNORMAL HIGH (ref 4.6–6.5)

## 2022-09-16 LAB — PSA: PSA: 0.74 ng/mL (ref 0.10–4.00)

## 2022-09-16 LAB — FERRITIN: Ferritin: 95 ng/mL (ref 22.0–322.0)

## 2022-09-23 ENCOUNTER — Encounter: Payer: BC Managed Care – PPO | Admitting: Family Medicine

## 2022-09-30 ENCOUNTER — Ambulatory Visit (INDEPENDENT_AMBULATORY_CARE_PROVIDER_SITE_OTHER): Payer: BC Managed Care – PPO | Admitting: Family Medicine

## 2022-09-30 ENCOUNTER — Telehealth: Payer: Self-pay

## 2022-09-30 ENCOUNTER — Encounter: Payer: Self-pay | Admitting: Family Medicine

## 2022-09-30 VITALS — BP 150/80 | HR 69 | Temp 97.9°F | Ht 65.0 in | Wt 201.0 lb

## 2022-09-30 DIAGNOSIS — E119 Type 2 diabetes mellitus without complications: Secondary | ICD-10-CM

## 2022-09-30 DIAGNOSIS — Z0000 Encounter for general adult medical examination without abnormal findings: Secondary | ICD-10-CM | POA: Diagnosis not present

## 2022-09-30 DIAGNOSIS — Z7189 Other specified counseling: Secondary | ICD-10-CM

## 2022-09-30 DIAGNOSIS — Z659 Problem related to unspecified psychosocial circumstances: Secondary | ICD-10-CM

## 2022-09-30 DIAGNOSIS — E7800 Pure hypercholesterolemia, unspecified: Secondary | ICD-10-CM

## 2022-09-30 DIAGNOSIS — K219 Gastro-esophageal reflux disease without esophagitis: Secondary | ICD-10-CM

## 2022-09-30 MED ORDER — GLIMEPIRIDE 2 MG PO TABS: ORAL_TABLET | ORAL | 1 refills | Status: DC

## 2022-09-30 MED ORDER — LANSOPRAZOLE 30 MG PO CPDR: 30.0000 mg | DELAYED_RELEASE_CAPSULE | Freq: Every day | ORAL | 1 refills | Status: DC

## 2022-09-30 NOTE — Telephone Encounter (Signed)
PA needed to Lansoprazole 30 gm caps.  PA started in [email or web address]; KEY: [id number]; awaiting determination.

## 2022-09-30 NOTE — Patient Instructions (Addendum)
Ask the front about getting set up for the diabetic eye exam here at clinic.   Increase prevacid and glimepiride to 2 a day using the pills you have, until the next rxs come in.    The new rxs are for the higher strengths so go back to 1 a day with those.   Start checking your sugar in the meantime and update me in about 10 days.    Take care.  Glad to see you.  Plan on recheck in about 3 months.  Labs ahead of time.

## 2022-09-30 NOTE — Progress Notes (Unsigned)
CPE- See plan.  Routine anticipatory guidance given to patient.  See health maintenance.  The possibility exists that previously documented standard health maintenance information may have been brought forward from a previous encounter into this note.  If needed, that same information has been updated to reflect the current situation based on today's encounter.    Tetanus 2016 Flu shot 2023 PNA 2014 Shingles d/w pt.   covid vaccine prev done.  D/w pt.   Colonoscopy 2020 Prostate cancer screening and PSA options (with potential risks and benefits of testing vs not testing) were discussed along with recent recs/guidelines.  FH noted.  2024 Diet and exercise d/w pt.  Complicated by working nights.    Encouraged exercise, done mainly at work, he is walking at work. Living will d/w pt.   Would have his wife designated if patient were incapacitated.   Diabetes:  Using medications without difficulties: yes Hypoglycemic episodes:no Hyperglycemic episodes:no Feet problems: no Blood Sugars averaging: not checked.   eye exam within last year: d/w pt.    Elevated Cholesterol: Using medications without problems: yes Muscle aches: no Diet compliance: d/w pt.  Exercise: d/w pt.    Hemochromatosis lab d/w pt.  Diet d/w pt.    His daughter was still having trouble turning in work at school, d/w pt.  She is getting some help with tutoring.  Stressors d/w pt.  He had water damage in his house in January and that isn't fixed yet.  He had a "knot" at the epigastric area for the last month "likely I'm getting ready to get up and make a speech".  No SI/HI.  Discussed options.    PMH and SH reviewed  Meds, vitals, and allergies reviewed.   ROS: Per HPI.  Unless specifically indicated otherwise in HPI, the patient denies:  General: fever. Eyes: acute vision changes ENT: sore throat Cardiovascular: chest pain Respiratory: SOB GI: vomiting GU: dysuria Musculoskeletal: acute back pain Derm: acute  rash Neuro: acute motor dysfunction Psych: worsening mood Endocrine: polydipsia Heme: bleeding Allergy: hayfever  GEN: nad, alert and oriented HEENT: ncat NECK: supple w/o LA CV: rrr. PULM: ctab, no inc wob ABD: soft, +bs EXT: no edema SKIN: no acute rash  Diabetic foot exam: Normal inspection No skin breakdown No calluses  Normal DP pulses Normal sensation to light touch and monofilament Nails normal

## 2022-10-01 NOTE — Telephone Encounter (Signed)
Notify pt.  If he can't get it OTC, then please let me know.  Thanks.

## 2022-10-01 NOTE — Telephone Encounter (Signed)
PA denied. Reason for denial: the requested service is not a covered benefit booklet or plan documents.   Is there something else he can take or just do otc?

## 2022-10-01 NOTE — Telephone Encounter (Signed)
LMTCB

## 2022-10-01 NOTE — Telephone Encounter (Signed)
Patient called back and was advised that PA was denied. Patient stated he was already getting it over the counter and was fine to continue doing that.

## 2022-10-02 DIAGNOSIS — Z659 Problem related to unspecified psychosocial circumstances: Secondary | ICD-10-CM | POA: Insufficient documentation

## 2022-10-02 NOTE — Assessment & Plan Note (Signed)
Living will d/w pt.   Would have his wife designated if patient were incapacitated.

## 2022-10-02 NOTE — Assessment & Plan Note (Signed)
See above, support offered.  He'll update me as needed.

## 2022-10-02 NOTE — Assessment & Plan Note (Signed)
Tetanus 2016 Flu shot 2023 PNA 2014 Shingles d/w pt.   covid vaccine prev done.  D/w pt.   Colonoscopy 2020 Prostate cancer screening and PSA options (with potential risks and benefits of testing vs not testing) were discussed along with recent recs/guidelines.  FH noted.  2024 Diet and exercise d/w pt.  Complicated by working nights.    Encouraged exercise, done mainly at work, he is walking at work. Living will d/w pt.   Would have his wife designated if patient were incapacitated.

## 2022-10-02 NOTE — Assessment & Plan Note (Signed)
Suspect increasing GERD symptoms, increase Prevacid in the meantime to 30 mg.  Update me as needed.

## 2022-10-02 NOTE — Assessment & Plan Note (Signed)
Continue working on diet.  Increase glimepiride to 2 mg daily he will update me about his sugar.  See after visit summary.  Recheck periodically.

## 2022-10-02 NOTE — Assessment & Plan Note (Signed)
Continue pravastatin.  Continue work on diet and exercise.

## 2022-11-11 ENCOUNTER — Other Ambulatory Visit: Payer: Self-pay | Admitting: Family Medicine

## 2022-12-22 ENCOUNTER — Other Ambulatory Visit: Payer: Self-pay | Admitting: Family Medicine

## 2022-12-22 DIAGNOSIS — E119 Type 2 diabetes mellitus without complications: Secondary | ICD-10-CM

## 2022-12-23 ENCOUNTER — Other Ambulatory Visit (INDEPENDENT_AMBULATORY_CARE_PROVIDER_SITE_OTHER): Payer: BC Managed Care – PPO

## 2022-12-23 DIAGNOSIS — E119 Type 2 diabetes mellitus without complications: Secondary | ICD-10-CM | POA: Diagnosis not present

## 2022-12-23 LAB — CBC WITH DIFFERENTIAL/PLATELET
Basophils Absolute: 0.1 10*3/uL (ref 0.0–0.1)
Basophils Relative: 0.7 % (ref 0.0–3.0)
Eosinophils Absolute: 0.3 10*3/uL (ref 0.0–0.7)
Eosinophils Relative: 3.5 % (ref 0.0–5.0)
HCT: 44.7 % (ref 39.0–52.0)
Hemoglobin: 15.3 g/dL (ref 13.0–17.0)
Lymphocytes Relative: 43.6 % (ref 12.0–46.0)
Lymphs Abs: 3.5 10*3/uL (ref 0.7–4.0)
MCHC: 34.1 g/dL (ref 30.0–36.0)
MCV: 89.9 fl (ref 78.0–100.0)
Monocytes Absolute: 0.5 10*3/uL (ref 0.1–1.0)
Monocytes Relative: 5.9 % (ref 3.0–12.0)
Neutro Abs: 3.7 10*3/uL (ref 1.4–7.7)
Neutrophils Relative %: 46.3 % (ref 43.0–77.0)
Platelets: 254 10*3/uL (ref 150.0–400.0)
RBC: 4.97 Mil/uL (ref 4.22–5.81)
RDW: 13.2 % (ref 11.5–15.5)
WBC: 8 10*3/uL (ref 4.0–10.5)

## 2022-12-23 LAB — COMPREHENSIVE METABOLIC PANEL
ALT: 101 U/L — ABNORMAL HIGH (ref 0–53)
AST: 62 U/L — ABNORMAL HIGH (ref 0–37)
Albumin: 4.4 g/dL (ref 3.5–5.2)
Alkaline Phosphatase: 72 U/L (ref 39–117)
BUN: 16 mg/dL (ref 6–23)
CO2: 24 mEq/L (ref 19–32)
Calcium: 9.5 mg/dL (ref 8.4–10.5)
Chloride: 103 mEq/L (ref 96–112)
Creatinine, Ser: 0.98 mg/dL (ref 0.40–1.50)
GFR: 87.57 mL/min (ref >60.00)
Glucose, Bld: 127 mg/dL — ABNORMAL HIGH (ref 70–99)
Potassium: 4 mEq/L (ref 3.5–5.1)
Sodium: 138 mEq/L (ref 135–145)
Total Bilirubin: 0.9 mg/dL (ref 0.2–1.2)
Total Protein: 7.2 g/dL (ref 6.0–8.3)

## 2022-12-23 LAB — HEMOGLOBIN A1C: Hgb A1c MFr Bld: 8.7 % — ABNORMAL HIGH (ref 4.6–6.5)

## 2022-12-23 LAB — FERRITIN: Ferritin: 56.2 ng/mL (ref 22.0–322.0)

## 2022-12-30 ENCOUNTER — Ambulatory Visit: Payer: BC Managed Care – PPO | Admitting: Family Medicine

## 2023-01-02 ENCOUNTER — Encounter: Payer: Self-pay | Admitting: Family Medicine

## 2023-01-02 ENCOUNTER — Ambulatory Visit: Payer: BC Managed Care – PPO | Admitting: Family Medicine

## 2023-01-02 VITALS — BP 138/80 | HR 66 | Temp 98.1°F | Ht 65.0 in | Wt 198.0 lb

## 2023-01-02 DIAGNOSIS — K219 Gastro-esophageal reflux disease without esophagitis: Secondary | ICD-10-CM

## 2023-01-02 DIAGNOSIS — E119 Type 2 diabetes mellitus without complications: Secondary | ICD-10-CM

## 2023-01-02 DIAGNOSIS — Z7984 Long term (current) use of oral hypoglycemic drugs: Secondary | ICD-10-CM

## 2023-01-02 NOTE — Progress Notes (Signed)
Diabetes:  Using medications without difficulties: yes except he missed some doses of metformin in the AM.  D/w pt about using an alarm.   Hypoglycemic episodes: no Hyperglycemic episodes:no Feet problems: no Blood Sugars averaging: not checked often, he ordered another meter in the meantime.   eye exam within last year: d/w pt about scheduling.   Labs d/w pt.  A1c better.    He is stretching for sciatica pain.  No pain now.  He can update me as needed.  He had an episode of brief lightheadedness w/o syncope recently- no clear source.  He wasn't fasting.  No CP.  Not SOB.  D/w pt about observation and updating me if recurrent.  He agrees.  Hemochromatosis.  Ferritin lower.  HGB wnl.  LFTs still elevated but better than prior, d/w pt.   He has been working on diet.  His daughter's grades are better. He is still in the midst of home repairs.  D/w pt.    GERD improved and he backed down to 1 dose of PPI per day.    Meds, vitals, and allergies reviewed.  ROS: Per HPI unless specifically indicated in ROS section   GEN: nad, alert and oriented HEENT: ncat NECK: supple w/o LA CV: rrr. PULM: ctab, no inc wob ABD: soft, +bs EXT: no edema SKIN: no acute rash no jaundice.

## 2023-01-02 NOTE — Patient Instructions (Addendum)
Recheck labs prior to a visit in about 4 months.  Take care.  Glad to see you. Thanks for your effort.   Ask the front about the eye screening here.

## 2023-01-05 NOTE — Assessment & Plan Note (Signed)
GERD improved and he backed down to 1 dose of PPI per day.   Continue as is.  He agrees.

## 2023-01-05 NOTE — Assessment & Plan Note (Signed)
Ferritin lower.  HGB wnl.  LFTs still elevated but better than prior, d/w pt. continue work on diet and exercise.  Would not need phlebotomy at this point.

## 2023-01-05 NOTE — Assessment & Plan Note (Signed)
Labs d/w pt.  A1c better.   Recheck labs prior to a visit in about 4 months.  Discussed with patient about screening, see after visit summary.  Continue glimepiride metformin and diet/exercise.  Recheck periodically.  It is unclear about his episode of lightheadedness but I asked him to monitor this and update me as needed.  He agrees.

## 2023-04-02 ENCOUNTER — Other Ambulatory Visit: Payer: Self-pay | Admitting: Family Medicine

## 2023-05-08 ENCOUNTER — Other Ambulatory Visit: Payer: Self-pay | Admitting: Family Medicine

## 2023-05-13 ENCOUNTER — Other Ambulatory Visit (INDEPENDENT_AMBULATORY_CARE_PROVIDER_SITE_OTHER): Payer: BC Managed Care – PPO

## 2023-05-13 DIAGNOSIS — E119 Type 2 diabetes mellitus without complications: Secondary | ICD-10-CM | POA: Diagnosis not present

## 2023-05-13 LAB — CBC WITH DIFFERENTIAL/PLATELET
Basophils Absolute: 0.1 10*3/uL (ref 0.0–0.1)
Basophils Relative: 0.9 % (ref 0.0–3.0)
Eosinophils Absolute: 0.3 10*3/uL (ref 0.0–0.7)
Eosinophils Relative: 3.8 % (ref 0.0–5.0)
HCT: 43.2 % (ref 39.0–52.0)
Hemoglobin: 14.4 g/dL (ref 13.0–17.0)
Lymphocytes Relative: 41.1 % (ref 12.0–46.0)
Lymphs Abs: 2.9 10*3/uL (ref 0.7–4.0)
MCHC: 33.4 g/dL (ref 30.0–36.0)
MCV: 90.6 fl (ref 78.0–100.0)
Monocytes Absolute: 0.5 10*3/uL (ref 0.1–1.0)
Monocytes Relative: 6.6 % (ref 3.0–12.0)
Neutro Abs: 3.4 10*3/uL (ref 1.4–7.7)
Neutrophils Relative %: 47.6 % (ref 43.0–77.0)
Platelets: 247 10*3/uL (ref 150.0–400.0)
RBC: 4.77 Mil/uL (ref 4.22–5.81)
RDW: 13.2 % (ref 11.5–15.5)
WBC: 7.1 10*3/uL (ref 4.0–10.5)

## 2023-05-13 LAB — HEMOGLOBIN A1C: Hgb A1c MFr Bld: 9.5 % — ABNORMAL HIGH (ref 4.6–6.5)

## 2023-05-13 LAB — FERRITIN: Ferritin: 82.1 ng/mL (ref 22.0–322.0)

## 2023-05-20 ENCOUNTER — Encounter: Payer: Self-pay | Admitting: Family Medicine

## 2023-05-20 ENCOUNTER — Ambulatory Visit: Payer: BC Managed Care – PPO | Admitting: Family Medicine

## 2023-05-20 VITALS — BP 150/90 | HR 81 | Temp 97.8°F | Ht 65.0 in | Wt 197.0 lb

## 2023-05-20 DIAGNOSIS — I10 Essential (primary) hypertension: Secondary | ICD-10-CM

## 2023-05-20 DIAGNOSIS — E119 Type 2 diabetes mellitus without complications: Secondary | ICD-10-CM

## 2023-05-20 DIAGNOSIS — Z7984 Long term (current) use of oral hypoglycemic drugs: Secondary | ICD-10-CM

## 2023-05-20 NOTE — Patient Instructions (Signed)
Go to the lab on the way out.   If you have mychart we'll likely use that to update you.    Take care.  Glad to see you.

## 2023-05-20 NOTE — Progress Notes (Unsigned)
Diabetes:  Using medications without difficulties: yes, with occ skipped dose of metformin but not ADE on med.   Hypoglycemic episodes: no Hyperglycemic episodes:no Feet problems:no Blood Sugars averaging: not checked often.   eye exam within last year: done this month.   A1c d/w pt. fructosamine level pending.  Rationale for checking fructosamine discussed with patient.  Hemachromatosis labs d/w pt.  Ferritin stable.  HGB wnl.  Diet discussed with patient.  He was able to get his kitchen fixed.    BP elevated today, he is coming off shift and his sleep was disrupted.  D/w pt.  He can recheck BP at home an update me as needed.  BP has been lower at home, 137/90.    Meds, vitals, and allergies reviewed.   ROS: Per HPI unless specifically indicated in ROS section   GEN: nad, alert and oriented HEENT: ncat NECK: supple w/o LA CV: rrr. PULM: ctab, no inc wob ABD: soft, +bs EXT: no edema SKIN: Well-perfused.

## 2023-05-21 NOTE — Assessment & Plan Note (Signed)
Check fructosamine today.  If this leads to a significantly lower estimated A1c then we may not have to change his medications.  We talked about injection insulin versus non-insulin injection/Ozempic.  Routine cautions discussed with patient about both.  We can get his fructosamine level resulted, see about potential medication changes and then set follow-up at that point.  Continue metformin and glimepiride for now.

## 2023-05-21 NOTE — Assessment & Plan Note (Signed)
Hemoglobin stable.  We can recheck his labs periodically.  He will continue working on his diet.

## 2023-05-21 NOTE — Assessment & Plan Note (Signed)
He can update me if his blood pressure remains elevated.  Continue lisinopril and metoprolol for now.

## 2023-05-23 LAB — FRUCTOSAMINE: Fructosamine: 323 umol/L — ABNORMAL HIGH (ref 205–285)

## 2023-05-25 ENCOUNTER — Other Ambulatory Visit: Payer: Self-pay | Admitting: Family Medicine

## 2023-05-25 DIAGNOSIS — E119 Type 2 diabetes mellitus without complications: Secondary | ICD-10-CM

## 2023-07-16 ENCOUNTER — Other Ambulatory Visit: Payer: Self-pay | Admitting: Family Medicine

## 2023-08-18 ENCOUNTER — Other Ambulatory Visit (INDEPENDENT_AMBULATORY_CARE_PROVIDER_SITE_OTHER): Payer: BC Managed Care – PPO

## 2023-08-18 DIAGNOSIS — E119 Type 2 diabetes mellitus without complications: Secondary | ICD-10-CM | POA: Diagnosis not present

## 2023-08-18 DIAGNOSIS — Z7984 Long term (current) use of oral hypoglycemic drugs: Secondary | ICD-10-CM | POA: Diagnosis not present

## 2023-08-18 LAB — COMPREHENSIVE METABOLIC PANEL
ALT: 95 U/L — ABNORMAL HIGH (ref 0–53)
AST: 59 U/L — ABNORMAL HIGH (ref 0–37)
Albumin: 4.1 g/dL (ref 3.5–5.2)
Alkaline Phosphatase: 85 U/L (ref 39–117)
BUN: 13 mg/dL (ref 6–23)
CO2: 26 meq/L (ref 19–32)
Calcium: 8.8 mg/dL (ref 8.4–10.5)
Chloride: 102 meq/L (ref 96–112)
Creatinine, Ser: 0.96 mg/dL (ref 0.40–1.50)
GFR: 89.35 mL/min (ref >60.00)
Glucose, Bld: 221 mg/dL — ABNORMAL HIGH (ref 70–99)
Potassium: 4.3 meq/L (ref 3.5–5.1)
Sodium: 136 meq/L (ref 135–145)
Total Bilirubin: 0.7 mg/dL (ref 0.2–1.2)
Total Protein: 6.8 g/dL (ref 6.0–8.3)

## 2023-08-18 LAB — FERRITIN: Ferritin: 68.3 ng/mL (ref 22.0–322.0)

## 2023-08-18 LAB — HEMOGLOBIN A1C: Hgb A1c MFr Bld: 10.4 % — ABNORMAL HIGH (ref 4.6–6.5)

## 2023-08-18 LAB — CBC WITH DIFFERENTIAL/PLATELET
Basophils Absolute: 0.1 10*3/uL (ref 0.0–0.1)
Basophils Relative: 0.8 % (ref 0.0–3.0)
Eosinophils Absolute: 0.3 10*3/uL (ref 0.0–0.7)
Eosinophils Relative: 3.9 % (ref 0.0–5.0)
HCT: 46.1 % (ref 39.0–52.0)
Hemoglobin: 15.8 g/dL (ref 13.0–17.0)
Lymphocytes Relative: 39.9 % (ref 12.0–46.0)
Lymphs Abs: 2.8 10*3/uL (ref 0.7–4.0)
MCHC: 34.1 g/dL (ref 30.0–36.0)
MCV: 89.8 fL (ref 78.0–100.0)
Monocytes Absolute: 0.4 10*3/uL (ref 0.1–1.0)
Monocytes Relative: 6.3 % (ref 3.0–12.0)
Neutro Abs: 3.5 10*3/uL (ref 1.4–7.7)
Neutrophils Relative %: 49.1 % (ref 43.0–77.0)
Platelets: 240 10*3/uL (ref 150.0–400.0)
RBC: 5.14 Mil/uL (ref 4.22–5.81)
RDW: 12.9 % (ref 11.5–15.5)
WBC: 7.1 10*3/uL (ref 4.0–10.5)

## 2023-08-22 LAB — FRUCTOSAMINE: Fructosamine: 346 umol/L — ABNORMAL HIGH (ref 205–285)

## 2023-08-25 ENCOUNTER — Encounter: Payer: Self-pay | Admitting: Family Medicine

## 2023-08-25 ENCOUNTER — Ambulatory Visit: Payer: BC Managed Care – PPO | Admitting: Family Medicine

## 2023-08-25 VITALS — BP 150/100 | HR 62 | Temp 97.9°F | Ht 65.0 in | Wt 199.5 lb

## 2023-08-25 DIAGNOSIS — Z125 Encounter for screening for malignant neoplasm of prostate: Secondary | ICD-10-CM | POA: Diagnosis not present

## 2023-08-25 DIAGNOSIS — I10 Essential (primary) hypertension: Secondary | ICD-10-CM | POA: Diagnosis not present

## 2023-08-25 DIAGNOSIS — E119 Type 2 diabetes mellitus without complications: Secondary | ICD-10-CM

## 2023-08-25 DIAGNOSIS — Z7984 Long term (current) use of oral hypoglycemic drugs: Secondary | ICD-10-CM

## 2023-08-25 LAB — MICROALBUMIN / CREATININE URINE RATIO
Creatinine,U: 115.9 mg/dL
Microalb Creat Ratio: 2.1 mg/g (ref 0.0–30.0)
Microalb, Ur: 2.4 mg/dL — ABNORMAL HIGH (ref 0.0–1.9)

## 2023-08-25 NOTE — Assessment & Plan Note (Signed)
A1c 7.5 by fructosamine.  Continue metformin and glimepiride.    Continue work on diet and exercise.    Check sugar before and 2 hours after a meal and update me.   Recheck in 4 months.  Fasting labs 1 week ahead of time.

## 2023-08-25 NOTE — Patient Instructions (Addendum)
Go to the lab on the way out.   If you have mychart we'll likely use that to update you.    Take care.  Glad to see you.  Please check your BP a few times at home and let me know about that.   Check your sugar before and 2 hours after a meal and let me know about that too.   Recheck in 4 months.  Fasting labs 1 week ahead of time.

## 2023-08-25 NOTE — Assessment & Plan Note (Signed)
LFTs elevated but stable, ferritin wnl.  D/w pt about avoiding foods that would elevate his ferritin/hgb.  D/w pt that hemochromatosis could possibly contribute to A1c vs fructosamine variance. Recheck periodically.

## 2023-08-25 NOTE — Assessment & Plan Note (Signed)
Asked patient to check BP a few times at home and let me know about that. Continue lisinopril and metoprolol.  Labs d/w pt.

## 2023-08-25 NOTE — Progress Notes (Signed)
Diabetes:  Using medications without difficulties:yes Hypoglycemic episodes:no Hyperglycemic episodes:no Feet problems:no Blood Sugars averaging: ~120-130s on home check prior to meals.   eye exam within last year: yes Labs d/w pt.  Fructosamine converts to A1c ~7.5.  Compliant with metformin and glimepiride.   MALB pending. On ACE and statin.   Hemachromatosis.  Labs d/w pt.  Diet d/w pt.  No jaundice, no abd pain.    Hypertension:    Using medication without problems or lightheadedness: rarely lightheaded- unclear is sugar related.  Cautions d/w pt.  No syncope.   Chest pain with exertion: no Edema:no Short of breath:no  Christmas events d/w pt.    Meds, vitals, and allergies reviewed.   ROS: Per HPI unless specifically indicated in ROS section   GEN: nad, alert and oriented HEENT: ncat NECK: supple w/o LA CV: rrr.  PULM: ctab, no inc wob ABD: soft, +bs EXT: no edema SKIN: well perfused.

## 2023-10-23 ENCOUNTER — Other Ambulatory Visit: Payer: Self-pay | Admitting: Family Medicine

## 2023-10-24 NOTE — Telephone Encounter (Signed)
 Sent. Thanks.

## 2024-01-15 ENCOUNTER — Encounter: Payer: Self-pay | Admitting: Family Medicine

## 2024-01-15 ENCOUNTER — Ambulatory Visit: Admitting: Family Medicine

## 2024-01-15 VITALS — BP 140/90 | HR 82 | Temp 99.0°F | Ht 65.0 in | Wt 199.2 lb

## 2024-01-15 DIAGNOSIS — Z125 Encounter for screening for malignant neoplasm of prostate: Secondary | ICD-10-CM | POA: Diagnosis not present

## 2024-01-15 DIAGNOSIS — I10 Essential (primary) hypertension: Secondary | ICD-10-CM

## 2024-01-15 DIAGNOSIS — Z7984 Long term (current) use of oral hypoglycemic drugs: Secondary | ICD-10-CM

## 2024-01-15 DIAGNOSIS — E119 Type 2 diabetes mellitus without complications: Secondary | ICD-10-CM

## 2024-01-15 MED ORDER — METFORMIN HCL 500 MG PO TABS: ORAL_TABLET | ORAL | 3 refills | Status: AC

## 2024-01-15 MED ORDER — GLIMEPIRIDE 2 MG PO TABS: ORAL_TABLET | ORAL | 3 refills | Status: AC

## 2024-01-15 MED ORDER — LISINOPRIL 20 MG PO TABS: 20.0000 mg | ORAL_TABLET | Freq: Every day | ORAL | 3 refills | Status: AC

## 2024-01-15 MED ORDER — METOPROLOL SUCCINATE ER 100 MG PO TB24: ORAL_TABLET | ORAL | 3 refills | Status: AC

## 2024-01-15 MED ORDER — LANSOPRAZOLE 30 MG PO CPDR: 30.0000 mg | DELAYED_RELEASE_CAPSULE | Freq: Every day | ORAL | 3 refills | Status: DC

## 2024-01-15 MED ORDER — PRAVASTATIN SODIUM 20 MG PO TABS: 20.0000 mg | ORAL_TABLET | ORAL | 3 refills | Status: AC

## 2024-01-15 NOTE — Patient Instructions (Addendum)
 Let me know if you need an order for a sugar meter.  If your pressure stays elevated then let me know.  Update me as needed.  Take care.  Glad to see you.  Recheck in about 4 months, with labs ahead of time if possible.

## 2024-01-15 NOTE — Progress Notes (Unsigned)
 Diabetes:  Using medications without difficulties: yes, with occ skipped doses of metformin .   Hypoglycemic episodes: no sx  Hyperglycemic episodes: no sx Feet problems:no  Blood Sugars averaging: not checked eye exam within last year: done fall 2024.  [facility] Address: [address] [place], [place], [place] [address] Phone: [phone] Labs pending.   He had multiple trees fall on his fence line, d/w pt. he had a lot of work to do at home.  Hypertension:    Using medication without problems or lightheadedness: yes Chest pain with exertion:no Edema:no Short of breath:no  Hemochromatosis.  Lab pending.  Limiting red meat.  No jaundice, no abd pain.   He is at a new job, down at [employer].  He is going to [facility] daily for pre work education.    Meds, vitals, and allergies reviewed.   ROS: Per HPI unless specifically indicated in ROS section   GEN: nad, alert and oriented HEENT: ncat NECK: supple w/o LA CV: rrr. PULM: ctab, no inc wob ABD: soft, +bs EXT: no edema SKIN: no acute rash  Diabetic foot exam: Normal inspection except for healing scrape on the R heel- from where the storm door caught his foot.   No skin breakdown No calluses  Normal DP pulses Normal sensation to light touch and monofilament Nails normal

## 2024-01-16 LAB — LIPID PANEL
Cholesterol: 162 mg/dL (ref 0–200)
HDL: 39.1 mg/dL (ref >39.00)
LDL Cholesterol: 93 mg/dL (ref 0–99)
NonHDL: 122.94
Total CHOL/HDL Ratio: 4
Triglycerides: 148 mg/dL (ref 0.0–149.0)
VLDL: 29.6 mg/dL (ref 0.0–40.0)

## 2024-01-16 LAB — CBC WITH DIFFERENTIAL/PLATELET
Basophils Absolute: 0.1 10*3/uL (ref 0.0–0.1)
Basophils Relative: 0.8 % (ref 0.0–3.0)
Eosinophils Absolute: 0.2 10*3/uL (ref 0.0–0.7)
Eosinophils Relative: 2.2 % (ref 0.0–5.0)
HCT: 44.2 % (ref 39.0–52.0)
Hemoglobin: 15.3 g/dL (ref 13.0–17.0)
Lymphocytes Relative: 46.3 % — ABNORMAL HIGH (ref 12.0–46.0)
Lymphs Abs: 3.8 10*3/uL (ref 0.7–4.0)
MCHC: 34.7 g/dL (ref 30.0–36.0)
MCV: 87.3 fl (ref 78.0–100.0)
Monocytes Absolute: 0.4 10*3/uL (ref 0.1–1.0)
Monocytes Relative: 4.4 % (ref 3.0–12.0)
Neutro Abs: 3.8 10*3/uL (ref 1.4–7.7)
Neutrophils Relative %: 46.3 % (ref 43.0–77.0)
Platelets: 252 10*3/uL (ref 150.0–400.0)
RBC: 5.06 Mil/uL (ref 4.22–5.81)
RDW: 12.9 % (ref 11.5–15.5)
WBC: 8.2 10*3/uL (ref 4.0–10.5)

## 2024-01-16 LAB — FERRITIN: Ferritin: 83.2 ng/mL (ref 22.0–322.0)

## 2024-01-16 LAB — COMPREHENSIVE METABOLIC PANEL WITH GFR
ALT: 105 U/L — ABNORMAL HIGH (ref 0–53)
AST: 73 U/L — ABNORMAL HIGH (ref 0–37)
Albumin: 4.6 g/dL (ref 3.5–5.2)
Alkaline Phosphatase: 84 U/L (ref 39–117)
BUN: 13 mg/dL (ref 6–23)
CO2: 25 meq/L (ref 19–32)
Calcium: 9.5 mg/dL (ref 8.4–10.5)
Chloride: 100 meq/L (ref 96–112)
Creatinine, Ser: 0.75 mg/dL (ref 0.40–1.50)
GFR: 101.71 mL/min (ref >60.00)
Glucose, Bld: 137 mg/dL — ABNORMAL HIGH (ref 70–99)
Potassium: 4.1 meq/L (ref 3.5–5.1)
Sodium: 136 meq/L (ref 135–145)
Total Bilirubin: 0.9 mg/dL (ref 0.2–1.2)
Total Protein: 7.1 g/dL (ref 6.0–8.3)

## 2024-01-16 LAB — PSA: PSA: 0.63 ng/mL (ref 0.10–4.00)

## 2024-01-16 LAB — HEMOGLOBIN A1C: Hgb A1c MFr Bld: 10.6 % — ABNORMAL HIGH (ref 4.6–6.5)

## 2024-01-19 ENCOUNTER — Ambulatory Visit: Payer: Self-pay | Admitting: Family Medicine

## 2024-01-19 LAB — FRUCTOSAMINE: Fructosamine: 376 umol/L — ABNORMAL HIGH (ref 205–285)

## 2024-01-19 NOTE — Assessment & Plan Note (Signed)
 Lab pending.  Limiting red meat.  No jaundice, no abd pain.  Diet discussed.

## 2024-01-19 NOTE — Assessment & Plan Note (Signed)
 Recheck in about 4 months, with labs ahead of time if possible.  See notes on labs.  Continue work on diet and exercise.  Continue glimepiride  metformin  in the meantime. Requesting eye exam report.

## 2024-01-19 NOTE — Assessment & Plan Note (Signed)
 Continue metoprolol  and lisinopril .  He can let me know if his blood pressure remains elevated.

## 2024-01-22 ENCOUNTER — Encounter: Payer: Self-pay | Admitting: Family Medicine

## 2024-02-18 ENCOUNTER — Other Ambulatory Visit: Payer: Self-pay | Admitting: Family Medicine

## 2024-02-18 NOTE — Telephone Encounter (Signed)
 Left message for patient to return call. We received request from [facility] for a refill but we show the last refill was sent to [facility]. Confirming which pharmacy we are to use.

## 2024-02-19 NOTE — Telephone Encounter (Signed)
 Copied from CRM [phone]. Topic: Clinical - Prescription Issue >> Feb 18, 2024  3:45 PM Deaijah H wrote: Reason for CRM: Patient would like prescription Pending Pravastatin  Sodium 20 mg w/ [facility] to be cancelled and no prescriptions to be sent through [facility] for future refills

## 2024-06-25 ENCOUNTER — Other Ambulatory Visit: Payer: Self-pay | Admitting: Family Medicine

## 2024-06-25 DIAGNOSIS — E119 Type 2 diabetes mellitus without complications: Secondary | ICD-10-CM

## 2024-06-28 ENCOUNTER — Other Ambulatory Visit (INDEPENDENT_AMBULATORY_CARE_PROVIDER_SITE_OTHER)

## 2024-06-28 DIAGNOSIS — E119 Type 2 diabetes mellitus without complications: Secondary | ICD-10-CM | POA: Diagnosis not present

## 2024-06-29 LAB — COMPREHENSIVE METABOLIC PANEL WITH GFR
ALT: 71 U/L — ABNORMAL HIGH (ref 0–53)
AST: 50 U/L — ABNORMAL HIGH (ref 0–37)
Albumin: 4.7 g/dL (ref 3.5–5.2)
Alkaline Phosphatase: 73 U/L (ref 39–117)
BUN: 11 mg/dL (ref 6–23)
CO2: 26 meq/L (ref 19–32)
Calcium: 9.6 mg/dL (ref 8.4–10.5)
Chloride: 99 meq/L (ref 96–112)
Creatinine, Ser: 0.84 mg/dL (ref 0.40–1.50)
GFR: 97.98 mL/min (ref >60.00)
Glucose, Bld: 166 mg/dL — ABNORMAL HIGH (ref 70–99)
Potassium: 4.1 meq/L (ref 3.5–5.1)
Sodium: 135 meq/L (ref 135–145)
Total Bilirubin: 0.8 mg/dL (ref 0.2–1.2)
Total Protein: 7.1 g/dL (ref 6.0–8.3)

## 2024-06-29 LAB — CBC WITH DIFFERENTIAL/PLATELET
Basophils Absolute: 0.1 K/uL (ref 0.0–0.1)
Basophils Relative: 0.9 % (ref 0.0–3.0)
Eosinophils Absolute: 0.3 K/uL (ref 0.0–0.7)
Eosinophils Relative: 3.5 % (ref 0.0–5.0)
HCT: 43.2 % (ref 39.0–52.0)
Hemoglobin: 14.9 g/dL (ref 13.0–17.0)
Lymphocytes Relative: 43.7 % (ref 12.0–46.0)
Lymphs Abs: 3.2 K/uL (ref 0.7–4.0)
MCHC: 34.5 g/dL (ref 30.0–36.0)
MCV: 88.8 fl (ref 78.0–100.0)
Monocytes Absolute: 0.5 K/uL (ref 0.1–1.0)
Monocytes Relative: 6.3 % (ref 3.0–12.0)
Neutro Abs: 3.3 K/uL (ref 1.4–7.7)
Neutrophils Relative %: 45.6 % (ref 43.0–77.0)
Platelets: 243 K/uL (ref 150.0–400.0)
RBC: 4.87 Mil/uL (ref 4.22–5.81)
RDW: 12.8 % (ref 11.5–15.5)
WBC: 7.2 K/uL (ref 4.0–10.5)

## 2024-06-29 LAB — MICROALBUMIN / CREATININE URINE RATIO
Creatinine,U: 166.4 mg/dL
Microalb Creat Ratio: 53.9 mg/g — ABNORMAL HIGH (ref 0.0–30.0)
Microalb, Ur: 9 mg/dL — ABNORMAL HIGH (ref 0.0–1.9)

## 2024-06-29 LAB — HEMOGLOBIN A1C: Hgb A1c MFr Bld: 8.5 % — ABNORMAL HIGH (ref 4.6–6.5)

## 2024-06-29 LAB — FERRITIN: Ferritin: 56 ng/mL (ref 22.0–322.0)

## 2024-06-30 ENCOUNTER — Ambulatory Visit: Payer: Self-pay | Admitting: Family Medicine

## 2024-07-04 LAB — FRUCTOSAMINE: Fructosamine: 325 umol/L — ABNORMAL HIGH (ref 205–285)

## 2024-07-05 ENCOUNTER — Ambulatory Visit: Admitting: Family Medicine

## 2024-07-05 ENCOUNTER — Encounter: Payer: Self-pay | Admitting: Family Medicine

## 2024-07-05 VITALS — BP 158/88 | HR 80 | Temp 97.8°F | Ht 64.0 in | Wt 189.5 lb

## 2024-07-05 DIAGNOSIS — Z23 Encounter for immunization: Secondary | ICD-10-CM | POA: Diagnosis not present

## 2024-07-05 DIAGNOSIS — I10 Essential (primary) hypertension: Secondary | ICD-10-CM

## 2024-07-05 DIAGNOSIS — Z0000 Encounter for general adult medical examination without abnormal findings: Secondary | ICD-10-CM

## 2024-07-05 DIAGNOSIS — E7800 Pure hypercholesterolemia, unspecified: Secondary | ICD-10-CM | POA: Diagnosis not present

## 2024-07-05 DIAGNOSIS — Z7189 Other specified counseling: Secondary | ICD-10-CM

## 2024-07-05 DIAGNOSIS — E119 Type 2 diabetes mellitus without complications: Secondary | ICD-10-CM

## 2024-07-05 DIAGNOSIS — E1129 Type 2 diabetes mellitus with other diabetic kidney complication: Secondary | ICD-10-CM

## 2024-07-05 NOTE — Patient Instructions (Addendum)
 Let me know if your BP stays above 140/90.   Let me know if the pectoral soreness isn't getting better.  Take care.  Glad to see you. Recheck in about 4 months.  Labs prior to the visit.  Ill check on farxiga or similar.   Update me as needed.

## 2024-07-05 NOTE — Progress Notes (Unsigned)
 CPE- See plan.  Routine anticipatory guidance given to patient.  See health maintenance.  The possibility exists that previously documented standard health maintenance information may have been brought forward from a previous encounter into this note.  If needed, that same information has been updated to reflect the current situation based on today's encounter.    Tetanus 2016 Flu shot 2025 PNA 2014 Shingles prev   covid vaccine prev done.   Colonoscopy 2020 Prostate cancer screening and PSA options (with potential risks and benefits of testing vs not testing) were discussed along with recent recs/guidelines.  FH noted.  PSA wnl 2025.   Diet and exercise d/w pt.  Walking at work. Living will d/w pt.   Would have his wife designated if patient were incapacitated.   Diabetes:  Using medications without difficulties:yes Hypoglycemic episodes:no Hyperglycemic episodes:no Feet problems:pain after prolonged working shift.   Blood Sugars averaging: checked episodically, usually ~100s.  eye exam within last year: due, d/w pt.   MALB d/w pt.  D/w pt about farxiga start, d/w pt about pharmacy help with med assistance.   Fructosamine converts to A1c of approximately 7.1.   Hypertension:    Using medication without problems or lightheadedness: yes  Chest pain with exertion: R sided chest discomfort episodically, unclear if exertional vs overuse.  Going on episodically for about 2-3 weeks.  Variable- some days w/o sx.   Edema: no Short of breath: no BP improved at home.    Still on statin at baseline.  Labs d/w pt.    Hemochromatosis lab d/w pt.  Diet d/w pt.    He is back to working nights.    PMH and SH reviewed  Meds, vitals, and allergies reviewed.   ROS: Per HPI.  Unless specifically indicated otherwise in HPI, the patient denies:  General: fever. Eyes: acute vision changes ENT: sore throat Cardiovascular: chest pain Respiratory: SOB GI: vomiting GU: dysuria Musculoskeletal:  acute back pain Derm: acute rash Neuro: acute motor dysfunction Psych: worsening mood Endocrine: polydipsia Heme: bleeding Allergy: hayfever  GEN: nad, alert and oriented HEENT: mucous membranes moist NECK: supple w/o LA CV: rrr. R pectoral area sore on testing with twisting- is reproducible.   PULM: ctab, no inc wob ABD: soft, +bs EXT: no edema SKIN: well perfused.

## 2024-07-07 NOTE — Assessment & Plan Note (Signed)
 Continue pravastatin .  Labs d/w pt.  Continue work on diet and exercise.

## 2024-07-07 NOTE — Assessment & Plan Note (Signed)
  Tetanus 2016 Flu shot 2025 PNA 2014 Shingles prev   covid vaccine prev done.   Colonoscopy 2020 Prostate cancer screening and PSA options (with potential risks and benefits of testing vs not testing) were discussed along with recent recs/guidelines.  FH noted.  PSA wnl 2025.   Diet and exercise d/w pt.  Walking at work. Living will d/w pt.   Would have his wife designated if patient were incapacitated.

## 2024-07-07 NOTE — Assessment & Plan Note (Signed)
Living will d/w pt.   Would have his wife designated if patient were incapacitated.  

## 2024-07-07 NOTE — Assessment & Plan Note (Signed)
 CP is reproducible and looks like benign MSK source.  He can update me as needed.  BP improved at home.  Okay for outpatient f/u . Continue metoprolol  and lisinopril .

## 2024-07-07 NOTE — Assessment & Plan Note (Signed)
 MALB d/w pt.  D/w pt about farxiga start, d/w pt about pharmacy help with med assistance.  Referral placed.   Fructosamine converts to A1c of approximately 7.1.  Continue work on diet and exercise.   Continue glimepiride  metformin .

## 2024-07-07 NOTE — Assessment & Plan Note (Signed)
 Labs improved, he is working on diet.  D/w pt.

## 2024-07-15 ENCOUNTER — Telehealth: Payer: Self-pay

## 2024-07-15 NOTE — Progress Notes (Signed)
 Complex Care Management Note  Care Guide Note 07/15/2024 Name: CORDARIOUS ZEEK MRN: [id number] DOB: 07/19/1971  Norleen DELENA Kitty is a 53 y.o. year old male who sees Cleatus Arlyss RAMAN, MD for primary care. I reached out to Norleen DELENA Kitty by phone today to offer complex care management services.  Mr. Fogel was given information about Complex Care Management services today including:   The Complex Care Management services include support from the care team which includes your Nurse Care Manager, Clinical Social Worker, or Pharmacist.  The Complex Care Management team is here to help remove barriers to the health concerns and goals most important to you. Complex Care Management services are voluntary, and the patient may decline or stop services at any time by request to their care team member.   Complex Care Management Consent Status: Patient agreed to services and verbal consent obtained.   Follow up plan:  Telephone appointment with complex care management team member scheduled for:  07/21/24 at 9:00 a.m.   Encounter Outcome:  Patient Scheduled  Dreama [facility]  [facility], [facility] VBCI Assistant Direct Dial: [phone]  Fax: [phone]

## 2024-07-15 NOTE — Progress Notes (Signed)
 Complex Care Management Note Care Guide Note  07/15/2024 Name: Micheal Cruz MRN: [id number] DOB: 24-May-1971   Complex Care Management Outreach Attempts: An unsuccessful telephone outreach was attempted today to offer the patient information about available complex care management services.  Follow Up Plan:  Additional outreach attempts will be made to offer the patient complex care management information and services.   Encounter Outcome:  No Answer  Dreama [facility]  [facility] VBCI Assistant Direct Dial: [phone]  Fax: [phone]

## 2024-07-21 ENCOUNTER — Other Ambulatory Visit: Admitting: Pharmacist

## 2024-07-21 ENCOUNTER — Other Ambulatory Visit (HOSPITAL_COMMUNITY): Payer: Self-pay

## 2024-07-21 DIAGNOSIS — R809 Proteinuria, unspecified: Secondary | ICD-10-CM

## 2024-07-21 MED ORDER — EMPAGLIFLOZIN 10 MG PO TABS: 10.0000 mg | ORAL_TABLET | Freq: Every day | ORAL | 1 refills | Status: AC

## 2024-07-21 NOTE — Progress Notes (Signed)
   07/21/2024 Name: Micheal Cruz MRN: [id number] DOB: Jul 31, 1971  Subjective  Chief Complaint  Patient presents with   Diabetes   Medication Access    Care Team: Primary Care Provider: Cleatus Arlyss RAMAN, MD  Reason for visit: ?  Micheal Cruz is a 53 y.o. male who presents today for a telephone visit with the pharmacist due to medication access concerns regarding their Doreen. ?   Medication Access:  Patient reports he has never been prescribed a brand medication on current insurance plan and is unfamiliar with cost/coverage.  Prescription drug coverage: YES RX EXPRESS SCRIPTS/RX [facility] S[facility] [id number]) (Primary)  BIN [id number], PCN CAL CHANDA AWE, ID [id number]  Person code 001 Ins Phone: [phone] Summary of Benefit: Plan 2C = Essential C Formulary [email or web address] Doreen = Tier 3. Covered without Prior Auth   Assessment and Plan:   1. Diabetes (type 2) with microalbuminuria.  Discussed SGLT2i including indication for diabetes and kidney protection in the setting of albuminuria on recent labs.  Reviewed dosing, administration, and possible side effects of SGLT2i therapy.    2. Medication Access Doreen is covered without prior auth per [employer] formulary Brand med cost unclear on [employer]. Test claim requested from [facility] team Patient should be eligible for copay card which would theoretically reduce monthly copay by up to $175  Called [facility]:  90 days = $487.  Agent reports there is no deductible. The copay/shared cost for a Tier 3 medication on his plan is: 0-30 days supply = $195 90 day supply = $487Jardiance  Savings Card: RxBIN: [id number] RxPCN: Loyalty RxGRP: [id number] ISSUER: ([id number]) ID: [id number] -$175/month   If [facility]: $20/month (even cheaper if copay card will work on 90-day supply).  MyChart Msg sent  to patient  No future appointments.  Manuelita FABIENE Kobs, PharmD Clinical Pharmacist [facility] [phone]

## 2024-07-21 NOTE — Patient Instructions (Addendum)
 Mr. BANDON SHERWIN,   It was a pleasure to speak with you today! As we discussed:?   Your insurance does cover Jardiance  (this is an equivalent to Farxiga. Usually insurance companies prefer one or the other). Both medications are used to lower blood sugar and both offer the kidney protection we discussed including improvement in the urine protein number (UACR).   You may get a message from the mail order pharmacy that the cost is high ($487 for a 90-day supply). This is without any savings cards, so we can get that lower.   I spoke with your insurance plan and pharmacy today. For brand medicines (Tier 3): 0-30 days supply = $195 90 day supply = $487  There IS a savings card for Jardiance  that will reduce each 30-day supply by $175 meaning a 30-day supply would hopefully be ~$20. (I'm not sure if it would work on a 90-day supply, but if it does it would be even cheaper)  Savings cards typically are not accepted by [facility] mail order, though can be used at a preferred local pharmacy. It looks like your [facility] plan prefers [facility]  **Please let me know if there is a convenient location for you for either [facility] or [facility] and we can try to get the medication transferred there to at least confirm the cost with the savings card. **      Please reach out prior to your next scheduled appointment should you have any questions or concerns.   You may reach me via MyChart Message, or you may leave me a voicemail at [phone] and I will get back to you shortly.   Thank you!   Manuelita FABIENE Kobs, PharmD [facility] [phone]

## 2024-07-21 NOTE — Progress Notes (Signed)
I signed the order  Thanks!

## 2024-07-26 ENCOUNTER — Encounter: Payer: Self-pay | Admitting: Pharmacist

## 2024-07-26 NOTE — Progress Notes (Unsigned)
 Chart Review Reason: Medication Follow up  Summary: [facility]ardiance  [facility] per coverage requirement.   Prior Authorization via CMM: Key: [id number] = [employer] does not manage prior authorizations for this patient. Please resubmit the ePA request to [employer]. Key: [id number] [employer] form = Prior Authorization Not Required   [facility] states prior shara is needed despite insurance formulary stating no PA needed, and [facility] previously billed successfully without need for PA.   [facility] states Prior auth reason listed must try metformin  first [employer] does not manage prior authorizations for this patient. Please resubmit the ePA request to [employer].    12/3: Spoke with insurance company to complete prior auth over the phone given CMM states PA Not Required.  Prior [employer] = [phone] [email or web address] Approved 07/28/2024 - 07/28/2025   Manuelita FABIENE Kobs, PharmD Clinical Pharmacist [facility] [phone]

## 2024-07-27 ENCOUNTER — Other Ambulatory Visit (HOSPITAL_COMMUNITY): Payer: Self-pay
